# Patient Record
Sex: Female | Born: 1955 | Race: White | Hispanic: No | Marital: Married | Smoking: Never smoker
Health system: Southern US, Community
[De-identification: ages and names within clinical notes are randomized; demographics above are authoritative.]

## PROBLEM LIST (undated history)

## (undated) DIAGNOSIS — I2510 Atherosclerotic heart disease of native coronary artery without angina pectoris: Secondary | ICD-10-CM

## (undated) DIAGNOSIS — E785 Hyperlipidemia, unspecified: Secondary | ICD-10-CM

## (undated) HISTORY — PX: SKIN SURGERY: SHX2413

## (undated) HISTORY — DX: Atherosclerotic heart disease of native coronary artery without angina pectoris: I25.10

## (undated) HISTORY — DX: Hyperlipidemia, unspecified: E78.5

## (undated) HISTORY — PX: OTHER SURGICAL HISTORY: SHX169

---

## 1997-08-04 ENCOUNTER — Ambulatory Visit (HOSPITAL_COMMUNITY): Admission: RE | Admit: 1997-08-04 | Discharge: 1997-08-04 | Payer: Self-pay | Admitting: *Deleted

## 1998-08-02 ENCOUNTER — Other Ambulatory Visit: Admission: RE | Admit: 1998-08-02 | Discharge: 1998-08-02 | Payer: Self-pay | Admitting: *Deleted

## 1998-09-11 ENCOUNTER — Ambulatory Visit (HOSPITAL_COMMUNITY): Admission: RE | Admit: 1998-09-11 | Discharge: 1998-09-11 | Payer: Self-pay | Admitting: *Deleted

## 1998-09-11 ENCOUNTER — Encounter: Payer: Self-pay | Admitting: *Deleted

## 1999-08-09 ENCOUNTER — Other Ambulatory Visit: Admission: RE | Admit: 1999-08-09 | Discharge: 1999-08-09 | Payer: Self-pay | Admitting: *Deleted

## 1999-09-13 ENCOUNTER — Ambulatory Visit (HOSPITAL_COMMUNITY): Admission: RE | Admit: 1999-09-13 | Discharge: 1999-09-13 | Payer: Self-pay | Admitting: *Deleted

## 1999-09-13 ENCOUNTER — Encounter: Payer: Self-pay | Admitting: *Deleted

## 2000-08-20 ENCOUNTER — Other Ambulatory Visit: Admission: RE | Admit: 2000-08-20 | Discharge: 2000-08-20 | Payer: Self-pay | Admitting: *Deleted

## 2000-09-18 ENCOUNTER — Encounter (INDEPENDENT_AMBULATORY_CARE_PROVIDER_SITE_OTHER): Payer: Self-pay

## 2000-09-18 ENCOUNTER — Other Ambulatory Visit: Admission: RE | Admit: 2000-09-18 | Discharge: 2000-09-18 | Payer: Self-pay | Admitting: *Deleted

## 2000-09-22 ENCOUNTER — Encounter: Payer: Self-pay | Admitting: *Deleted

## 2000-09-22 ENCOUNTER — Ambulatory Visit (HOSPITAL_COMMUNITY): Admission: RE | Admit: 2000-09-22 | Discharge: 2000-09-22 | Payer: Self-pay | Admitting: *Deleted

## 2000-10-09 ENCOUNTER — Other Ambulatory Visit: Admission: RE | Admit: 2000-10-09 | Discharge: 2000-10-09 | Payer: Self-pay | Admitting: *Deleted

## 2000-10-09 ENCOUNTER — Encounter (INDEPENDENT_AMBULATORY_CARE_PROVIDER_SITE_OTHER): Payer: Self-pay | Admitting: Specialist

## 2001-08-31 ENCOUNTER — Other Ambulatory Visit: Admission: RE | Admit: 2001-08-31 | Discharge: 2001-08-31 | Payer: Self-pay | Admitting: *Deleted

## 2001-09-29 ENCOUNTER — Ambulatory Visit (HOSPITAL_COMMUNITY): Admission: RE | Admit: 2001-09-29 | Discharge: 2001-09-29 | Payer: Self-pay | Admitting: *Deleted

## 2001-09-29 ENCOUNTER — Encounter: Payer: Self-pay | Admitting: *Deleted

## 2002-09-13 ENCOUNTER — Other Ambulatory Visit: Admission: RE | Admit: 2002-09-13 | Discharge: 2002-09-13 | Payer: Self-pay | Admitting: Obstetrics and Gynecology

## 2002-10-04 ENCOUNTER — Ambulatory Visit (HOSPITAL_COMMUNITY): Admission: RE | Admit: 2002-10-04 | Discharge: 2002-10-04 | Payer: Self-pay | Admitting: Obstetrics and Gynecology

## 2002-10-04 ENCOUNTER — Encounter: Payer: Self-pay | Admitting: Obstetrics and Gynecology

## 2003-09-26 ENCOUNTER — Other Ambulatory Visit: Admission: RE | Admit: 2003-09-26 | Discharge: 2003-09-26 | Payer: Self-pay | Admitting: Obstetrics and Gynecology

## 2003-10-12 ENCOUNTER — Ambulatory Visit (HOSPITAL_COMMUNITY): Admission: RE | Admit: 2003-10-12 | Discharge: 2003-10-12 | Payer: Self-pay | Admitting: Obstetrics and Gynecology

## 2003-10-20 ENCOUNTER — Encounter: Admission: RE | Admit: 2003-10-20 | Discharge: 2003-10-20 | Payer: Self-pay | Admitting: Obstetrics and Gynecology

## 2004-11-22 ENCOUNTER — Encounter: Admission: RE | Admit: 2004-11-22 | Discharge: 2004-11-22 | Payer: Self-pay | Admitting: Obstetrics and Gynecology

## 2005-12-05 ENCOUNTER — Encounter: Admission: RE | Admit: 2005-12-05 | Discharge: 2005-12-05 | Payer: Self-pay | Admitting: Obstetrics and Gynecology

## 2006-04-07 ENCOUNTER — Emergency Department (HOSPITAL_COMMUNITY): Admission: EM | Admit: 2006-04-07 | Discharge: 2006-04-07 | Payer: Self-pay | Admitting: Emergency Medicine

## 2006-12-16 ENCOUNTER — Ambulatory Visit (HOSPITAL_COMMUNITY): Admission: RE | Admit: 2006-12-16 | Discharge: 2006-12-16 | Payer: Self-pay | Admitting: Family Medicine

## 2007-12-30 ENCOUNTER — Ambulatory Visit (HOSPITAL_COMMUNITY): Admission: RE | Admit: 2007-12-30 | Discharge: 2007-12-30 | Payer: Self-pay | Admitting: Obstetrics and Gynecology

## 2009-01-10 ENCOUNTER — Ambulatory Visit (HOSPITAL_COMMUNITY): Admission: RE | Admit: 2009-01-10 | Discharge: 2009-01-10 | Payer: Self-pay | Admitting: Obstetrics and Gynecology

## 2009-06-07 ENCOUNTER — Ambulatory Visit (HOSPITAL_BASED_OUTPATIENT_CLINIC_OR_DEPARTMENT_OTHER): Admission: RE | Admit: 2009-06-07 | Discharge: 2009-06-07 | Payer: Self-pay | Admitting: Obstetrics and Gynecology

## 2010-02-05 ENCOUNTER — Ambulatory Visit (HOSPITAL_COMMUNITY): Admission: RE | Admit: 2010-02-05 | Discharge: 2010-02-05 | Payer: Self-pay | Admitting: Family Medicine

## 2010-02-12 ENCOUNTER — Encounter: Admission: RE | Admit: 2010-02-12 | Discharge: 2010-02-12 | Payer: Self-pay | Admitting: Family Medicine

## 2010-06-03 ENCOUNTER — Encounter: Payer: Self-pay | Admitting: Obstetrics and Gynecology

## 2010-07-30 LAB — POCT HEMOGLOBIN-HEMACUE: Hemoglobin: 13.8 g/dL (ref 12.0–15.0)

## 2011-03-13 ENCOUNTER — Other Ambulatory Visit: Payer: Self-pay | Admitting: Family Medicine

## 2011-03-13 DIAGNOSIS — Z1231 Encounter for screening mammogram for malignant neoplasm of breast: Secondary | ICD-10-CM

## 2011-04-01 ENCOUNTER — Ambulatory Visit
Admission: RE | Admit: 2011-04-01 | Discharge: 2011-04-01 | Disposition: A | Discharge status: Home / Self Care | Payer: 59 | Source: Ambulatory Visit | Attending: Family Medicine | Admitting: Family Medicine

## 2011-04-01 DIAGNOSIS — Z1231 Encounter for screening mammogram for malignant neoplasm of breast: Secondary | ICD-10-CM

## 2012-03-25 ENCOUNTER — Other Ambulatory Visit: Payer: Self-pay | Admitting: Family Medicine

## 2012-03-25 DIAGNOSIS — Z1231 Encounter for screening mammogram for malignant neoplasm of breast: Secondary | ICD-10-CM

## 2012-05-12 ENCOUNTER — Ambulatory Visit
Admission: RE | Admit: 2012-05-12 | Discharge: 2012-05-12 | Disposition: A | Discharge status: Home / Self Care | Payer: 59 | Source: Ambulatory Visit | Attending: Family Medicine | Admitting: Family Medicine

## 2012-05-12 DIAGNOSIS — Z1231 Encounter for screening mammogram for malignant neoplasm of breast: Secondary | ICD-10-CM

## 2013-04-19 ENCOUNTER — Other Ambulatory Visit: Payer: Self-pay

## 2013-04-19 DIAGNOSIS — Z1231 Encounter for screening mammogram for malignant neoplasm of breast: Secondary | ICD-10-CM

## 2013-05-24 ENCOUNTER — Ambulatory Visit
Admission: RE | Admit: 2013-05-24 | Discharge: 2013-05-24 | Disposition: A | Discharge status: Home / Self Care | Payer: 59 | Source: Ambulatory Visit

## 2013-05-24 DIAGNOSIS — Z1231 Encounter for screening mammogram for malignant neoplasm of breast: Secondary | ICD-10-CM

## 2014-06-06 ENCOUNTER — Other Ambulatory Visit: Payer: Self-pay

## 2014-06-06 DIAGNOSIS — Z1231 Encounter for screening mammogram for malignant neoplasm of breast: Secondary | ICD-10-CM

## 2014-06-20 ENCOUNTER — Ambulatory Visit
Admission: RE | Admit: 2014-06-20 | Discharge: 2014-06-20 | Disposition: A | Discharge status: Home / Self Care | Payer: BLUE CROSS/BLUE SHIELD | Source: Ambulatory Visit

## 2014-06-20 DIAGNOSIS — Z1231 Encounter for screening mammogram for malignant neoplasm of breast: Secondary | ICD-10-CM

## 2015-05-30 ENCOUNTER — Other Ambulatory Visit: Payer: Self-pay

## 2015-05-30 DIAGNOSIS — Z1231 Encounter for screening mammogram for malignant neoplasm of breast: Secondary | ICD-10-CM

## 2015-06-23 ENCOUNTER — Ambulatory Visit
Admission: RE | Admit: 2015-06-23 | Discharge: 2015-06-23 | Disposition: A | Discharge status: Home / Self Care | Payer: BLUE CROSS/BLUE SHIELD | Source: Ambulatory Visit

## 2015-06-23 DIAGNOSIS — Z1231 Encounter for screening mammogram for malignant neoplasm of breast: Secondary | ICD-10-CM

## 2016-05-24 ENCOUNTER — Other Ambulatory Visit: Payer: Self-pay | Admitting: Family Medicine

## 2016-05-24 DIAGNOSIS — Z1231 Encounter for screening mammogram for malignant neoplasm of breast: Secondary | ICD-10-CM

## 2016-06-24 ENCOUNTER — Ambulatory Visit: Payer: BLUE CROSS/BLUE SHIELD

## 2016-06-25 ENCOUNTER — Ambulatory Visit
Admission: RE | Admit: 2016-06-25 | Discharge: 2016-06-25 | Disposition: A | Discharge status: Home / Self Care | Payer: BLUE CROSS/BLUE SHIELD | Source: Ambulatory Visit | Attending: Family Medicine | Admitting: Family Medicine

## 2016-06-25 DIAGNOSIS — Z1231 Encounter for screening mammogram for malignant neoplasm of breast: Secondary | ICD-10-CM

## 2017-05-26 ENCOUNTER — Other Ambulatory Visit: Payer: Self-pay | Admitting: Family Medicine

## 2017-05-26 DIAGNOSIS — Z1231 Encounter for screening mammogram for malignant neoplasm of breast: Secondary | ICD-10-CM

## 2017-07-08 ENCOUNTER — Ambulatory Visit
Admission: RE | Admit: 2017-07-08 | Discharge: 2017-07-08 | Disposition: A | Discharge status: Home / Self Care | Payer: BLUE CROSS/BLUE SHIELD | Source: Ambulatory Visit | Attending: Family Medicine | Admitting: Family Medicine

## 2017-07-08 DIAGNOSIS — Z1231 Encounter for screening mammogram for malignant neoplasm of breast: Secondary | ICD-10-CM

## 2018-06-15 ENCOUNTER — Other Ambulatory Visit: Payer: Self-pay | Admitting: Family Medicine

## 2018-06-15 DIAGNOSIS — Z1231 Encounter for screening mammogram for malignant neoplasm of breast: Secondary | ICD-10-CM

## 2018-07-29 ENCOUNTER — Ambulatory Visit: Payer: BLUE CROSS/BLUE SHIELD

## 2018-11-05 ENCOUNTER — Other Ambulatory Visit: Payer: Self-pay

## 2018-11-05 ENCOUNTER — Ambulatory Visit
Admission: RE | Admit: 2018-11-05 | Discharge: 2018-11-05 | Disposition: A | Discharge status: Home / Self Care | Payer: BC Managed Care – PPO | Source: Ambulatory Visit | Attending: Family Medicine | Admitting: Family Medicine

## 2018-11-05 DIAGNOSIS — Z1231 Encounter for screening mammogram for malignant neoplasm of breast: Secondary | ICD-10-CM

## 2019-04-20 ENCOUNTER — Other Ambulatory Visit: Payer: Self-pay

## 2019-04-20 DIAGNOSIS — Z20822 Contact with and (suspected) exposure to covid-19: Secondary | ICD-10-CM

## 2019-04-21 LAB — NOVEL CORONAVIRUS, NAA: SARS-CoV-2, NAA: NOT DETECTED

## 2019-07-27 ENCOUNTER — Other Ambulatory Visit: Payer: Self-pay | Admitting: Internal Medicine

## 2019-07-27 DIAGNOSIS — E785 Hyperlipidemia, unspecified: Secondary | ICD-10-CM

## 2019-08-12 ENCOUNTER — Other Ambulatory Visit: Payer: Self-pay | Admitting: Internal Medicine

## 2019-08-12 ENCOUNTER — Ambulatory Visit
Admission: RE | Admit: 2019-08-12 | Discharge: 2019-08-12 | Disposition: A | Discharge status: Home / Self Care | Payer: No Typology Code available for payment source | Source: Ambulatory Visit | Attending: Internal Medicine | Admitting: Internal Medicine

## 2019-08-12 DIAGNOSIS — E785 Hyperlipidemia, unspecified: Secondary | ICD-10-CM

## 2019-08-26 ENCOUNTER — Other Ambulatory Visit: Payer: Self-pay | Admitting: Internal Medicine

## 2019-08-26 DIAGNOSIS — E785 Hyperlipidemia, unspecified: Secondary | ICD-10-CM

## 2019-09-01 ENCOUNTER — Other Ambulatory Visit: Payer: BC Managed Care – PPO

## 2019-09-01 ENCOUNTER — Ambulatory Visit
Admission: RE | Admit: 2019-09-01 | Discharge: 2019-09-01 | Disposition: A | Discharge status: Home / Self Care | Payer: BC Managed Care – PPO | Source: Ambulatory Visit | Attending: Internal Medicine | Admitting: Internal Medicine

## 2019-09-01 DIAGNOSIS — E785 Hyperlipidemia, unspecified: Secondary | ICD-10-CM

## 2019-10-25 ENCOUNTER — Other Ambulatory Visit: Payer: Self-pay | Admitting: Obstetrics

## 2019-10-25 DIAGNOSIS — Z1231 Encounter for screening mammogram for malignant neoplasm of breast: Secondary | ICD-10-CM

## 2019-11-23 ENCOUNTER — Ambulatory Visit
Admission: RE | Admit: 2019-11-23 | Discharge: 2019-11-23 | Disposition: A | Discharge status: Home / Self Care | Payer: BC Managed Care – PPO | Source: Ambulatory Visit | Attending: Obstetrics | Admitting: Obstetrics

## 2019-11-23 ENCOUNTER — Other Ambulatory Visit: Payer: Self-pay

## 2019-11-23 DIAGNOSIS — Z1231 Encounter for screening mammogram for malignant neoplasm of breast: Secondary | ICD-10-CM

## 2020-04-12 NOTE — Progress Notes (Signed)
Cardiology Office Note:   Date:  04/14/2020  NAME:  Caroline Fowler    MRN: [id number] DOB:  [birth year]   PCP:  Maurice Small, MD  Cardiologist:  No primary care provider on file.   Referring MD: Melida Quitter, MD   Chief Complaint  Patient presents with  . Coronary Artery Disease    History of Present Illness:   Caroline Fowler is a 64 y.o. female with a hx of CAD/elevated CAC score who is being seen today for the evaluation of CAD at the request of Melida Quitter, MD.  She recently underwent coronary calcium scoring in April and found that she had an elevated value.  She was started on Crestor by her primary care physician.  Her most recent lipid profile shows her LDL is 55.  She was noted to have an abnormal EKG and then referred to cardiology for further evaluation.  She has a strong family history of heart disease.  Her brother died of a heart attack in his sleep.  Her sister and another brother have heart disease as well.  Blood pressure is a bit elevated today 146/92.  She reports that it is well within normal limits at other doctors visits and at home.  She is a bit nervous and anxious about today's visit.  She is an avid exerciser.  She walks 2 to 4 miles per day without any chest pain or shortness of breath.  She has a normal body weight with a BMI of 22.  Her diet is reported as very good.  She is a never smoker.  She does not drink alcohol or use drugs.  She overall is in good health and denies any symptoms of angina or chest pain.  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  This could be artifact.  She needs an echocardiogram.  She is also on aspirin.  She does take metoprolol but apparently her blood pressure has been within normal limits.  Problem List 1. CAD -CAC score 256 (94th percentile) 2. HLD -Total cholesterol 135, LDL 55, HDL 64, triglycerides 81.  Past Medical History: Past Medical History:  Diagnosis Date  . Hyperlipidemia     Past Surgical  History: Past Surgical History:  Procedure Laterality Date  . SKIN SURGERY      Current Medications: Current Meds  Medication Sig  . BABY ASPIRIN PO Take 81 mg by mouth daily.  Marland Kitchen levothyroxine (SYNTHROID) 25 MCG tablet Take 25 mcg by mouth daily before breakfast.  . metoprolol succinate (TOPROL-XL) 50 MG 24 hr tablet Take 50 mg by mouth daily. Take with or immediately following a meal.  . rosuvastatin (CRESTOR) 20 MG tablet Take 20 mg by mouth daily.  . SUMAtriptan (IMITREX) 100 MG tablet Take 100 mg by mouth as needed for migraine. May repeat in 2 hours if headache persists or recurs.     Allergies:    Penicillins and Sulfa antibiotics   Social History: Social History   Socioeconomic History  . Marital status: Married    Spouse name: Not on file  . Number of children: 2  . Years of education: Not on file  . Highest education level: Not on file  Occupational History  . Occupation: retired  Tobacco Use  . Smoking status: Never Smoker  . Smokeless tobacco: Never Used  Substance and Sexual Activity  . Alcohol use: Yes  . Drug use: Never  . Sexual activity: Not on file  Other Topics Concern  . Not  on file  Social History Narrative  . Not on file   Social Determinants of Health   Financial Resource Strain:   . Difficulty of Paying Living Expenses: Not on file  Food Insecurity:   . Worried About Programme researcher, broadcasting/film/video in the Last Year: Not on file  . Ran Out of Food in the Last Year: Not on file  Transportation Needs:   . Lack of Transportation (Medical): Not on file  . Lack of Transportation (Non-Medical): Not on file  Physical Activity:   . Days of Exercise per Week: Not on file  . Minutes of Exercise per Session: Not on file  Stress:   . Feeling of Stress : Not on file  Social Connections:   . Frequency of Communication with Friends and Family: Not on file  . Frequency of Social Gatherings with Friends and Family: Not on file  . Attends Religious Services: Not on  file  . Active Member of Clubs or Organizations: Not on file  . Attends Banker Meetings: Not on file  . Marital Status: Not on file     Family History: The patient's family history includes Heart attack in her brother and father.  ROS:   All other ROS reviewed and negative. Pertinent positives noted in the HPI.     EKGs/Labs/Other Studies Reviewed:   The following studies were personally reviewed by me today:  EKG:  EKG is ordered today.  The ekg ordered today demonstrates normal sinus rhythm, heart rate 60, anteroseptal infarct noted, and was personally reviewed by me.   CT CAC Score 09/01/2019 1. Three-vessel coronary artery calcification.  2. Total Agatston Score: 256  3. MESA age and sex matched database percentile: 85  Recent Labs: No results found for requested labs within last 8760 hours.   Recent Lipid Panel No results found for: CHOL, TRIG, HDL, CHOLHDL, VLDL, LDLCALC, LDLDIRECT  Physical Exam:   VS:  BP (!) 146/92 (BP Location: Left Arm, Patient Position: Sitting, Cuff Size: Normal)   Pulse 60   Ht 5\' 9"  (1.753 m)   Wt 150 lb (68 kg)   BMI 22.15 kg/m    Wt Readings from Last 3 Encounters:  04/14/20 150 lb (68 kg)    General: Well nourished, well developed, in no acute distress Heart: Atraumatic, normal size  Eyes: PEERLA, EOMI  Neck: Supple, no JVD Endocrine: No thryomegaly Cardiac: Normal S1, S2; RRR; no murmurs, rubs, or gallops Lungs: Clear to auscultation bilaterally, no wheezing, rhonchi or rales  Abd: Soft, nontender, no hepatomegaly  Ext: No edema, pulses 2+ Musculoskeletal: No deformities, BUE and BLE strength normal and equal Skin: Warm and dry, no rashes   Neuro: Alert and oriented to person, place, time, and situation, CNII-XII grossly intact, no focal deficits  Psych: Normal mood and affect   ASSESSMENT:   Caroline Fowler is a 64 y.o. female who presents for the following: 1. 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 Agatston coronary artery calcium score between 200 and 399   3. Nonspecific abnormal electrocardiogram (ECG) (EKG)     PLAN:   1. Coronary artery disease involving native coronary artery of native heart without angina pectoris 2. Agatston coronary artery calcium score between 200 and 399 3. Nonspecific abnormal electrocardiogram (ECG) (EKG) -Elevated coronary calcium score in the 94th percentile.  Still less than 400.  EKG with possible old anteroseptal infarct.  She needs an echo. -No symptoms  of angina.  Able to walk 2 to 4 miles per day without any limitations.  Given that her score is less than 400 I see no need for preemptive stress test.  She is doing this daily. -She should continue aspirin 81 mg a day.  She should keep an eye on her blood pressure.  She tells me it is normal at home.  Her LDL cholesterol is at goal.  55.  She is on Crestor 20 mg a day.  This is at goal for her.  I would not make any changes. -I will notify her of the results of her echo by phone.  She will see [place] yearly.  Disposition: Return in about 1 year (around 04/14/2021).  Medication Adjustments/Labs and Tests Ordered: Current medicines are reviewed at length with the patient today.  Concerns regarding medicines are outlined above.  Orders Placed This Encounter  Procedures  . EKG 12-Lead  . ECHOCARDIOGRAM COMPLETE   No orders of the defined types were placed in this encounter.   Patient Instructions  Medication Instructions:  Your physician recommends that you continue on your current medications as directed. Please refer to the Current Medication list given to you today.  *If you need a refill on your cardiac medications before your next appointment, please call your pharmacy*   Lab Work: -None If you have labs (blood work) drawn today and your tests are completely normal, you will receive your results only by: Marland Kitchen MyChart Message (if you  have MyChart) OR . A paper copy in the mail If you have any lab test that is abnormal or we need to change your treatment, we will call you to review the results.   Testing/Procedures: Your physician has requested that you have an echocardiogram. Echocardiography is a painless test that uses sound waves to create images of your heart. It provides your doctor with information about the size and shape of your heart and how well your heart's chambers and valves are working. This procedure takes approximately one hour. There are no restrictions for this procedure.     Follow-Up: At [facility], you and your health needs are our priority.  As part of our continuing mission to provide you with exceptional heart care, we have created designated Provider Care Teams.  These Care Teams include your primary Cardiologist (physician) and Advanced Practice Providers (APPs -  Physician Assistants and Nurse Practitioners) who all work together to provide you with the care you need, when you need it.  We recommend signing up for the patient portal called "MyChart".  Sign up information is provided on this After Visit Summary.  MyChart is used to connect with patients for Virtual Visits (Telemedicine).  Patients are able to view lab/test results, encounter notes, upcoming appointments, etc.  Non-urgent messages can be sent to your provider as well.   To learn more about what you can do with MyChart, go to [email or web address].    Your next appointment:   1 year(s)  The format for your next appointment:   In Person  Provider:   Lennie Odor, MD   Other Instructions Your physician wants you to follow-up in: 1 year with Dr. Flora Lipps.  You will receive a reminder letter in the mail two months in advance. If you don't receive a letter, please call our office to schedule the follow-up appointment.        Signed, Lenna Gilford. Flora Lipps, MD [facility]  [address] [place], [place]  [id number] [phone]  04/14/2020 9:25 AM

## 2020-04-14 ENCOUNTER — Encounter: Payer: Self-pay | Admitting: Cardiovascular Disease

## 2020-04-14 ENCOUNTER — Ambulatory Visit (INDEPENDENT_AMBULATORY_CARE_PROVIDER_SITE_OTHER): Payer: BC Managed Care – PPO | Admitting: Cardiovascular Disease

## 2020-04-14 ENCOUNTER — Other Ambulatory Visit: Payer: Self-pay

## 2020-04-14 VITALS — BP 146/92 | HR 60 | Ht 69.0 in | Wt 150.0 lb

## 2020-04-14 DIAGNOSIS — R931 Abnormal findings on diagnostic imaging of heart and coronary circulation: Secondary | ICD-10-CM | POA: Diagnosis not present

## 2020-04-14 DIAGNOSIS — I2510 Atherosclerotic heart disease of native coronary artery without angina pectoris: Secondary | ICD-10-CM

## 2020-04-14 DIAGNOSIS — R9431 Abnormal electrocardiogram [ECG] [EKG]: Secondary | ICD-10-CM | POA: Diagnosis not present

## 2020-04-14 NOTE — Patient Instructions (Signed)
Medication Instructions:  Your physician recommends that you continue on your current medications as directed. Please refer to the Current Medication list given to you today.  *If you need a refill on your cardiac medications before your next appointment, please call your pharmacy*   Lab Work: -None If you have labs (blood work) drawn today and your tests are completely normal, you will receive your results only by:  MyChart Message (if you have MyChart) OR  A paper copy in the mail If you have any lab test that is abnormal or we need to change your treatment, we will call you to review the results.   Testing/Procedures: Your physician has requested that you have an echocardiogram. Echocardiography is a painless test that uses sound waves to create images of your heart. It provides your doctor with information about the size and shape of your heart and how well your hearts chambers and valves are working. This procedure takes approximately one hour. There are no restrictions for this procedure.     Follow-Up: At [facility], you and your health needs are our priority.  As part of our continuing mission to provide you with exceptional heart care, we have created designated Provider Care Teams.  These Care Teams include your primary Cardiologist (physician) and Advanced Practice Providers (APPs -  Physician Assistants and Nurse Practitioners) who all work together to provide you with the care you need, when you need it.  We recommend signing up for the patient portal called "MyChart".  Sign up information is provided on this After Visit Summary.  MyChart is used to connect with patients for Virtual Visits (Telemedicine).  Patients are able to view lab/test results, encounter notes, upcoming appointments, etc.  Non-urgent messages can be sent to your provider as well.   To learn more about what you can do with MyChart, go to [email or web address].    Your next appointment:   1  year(s)  The format for your next appointment:   In Person  Provider:   Lennie Odor, MD   Other Instructions Your physician wants you to follow-up in: 1 year with Dr. Flora Lipps.  You will receive a reminder letter in the mail two months in advance. If you don't receive a letter, please call our office to schedule the follow-up appointment.

## 2020-05-08 ENCOUNTER — Ambulatory Visit (HOSPITAL_COMMUNITY): Payer: BC Managed Care – PPO | Attending: Cardiology

## 2020-05-08 ENCOUNTER — Other Ambulatory Visit: Payer: Self-pay

## 2020-05-08 DIAGNOSIS — I2510 Atherosclerotic heart disease of native coronary artery without angina pectoris: Secondary | ICD-10-CM | POA: Insufficient documentation

## 2020-05-08 DIAGNOSIS — R931 Abnormal findings on diagnostic imaging of heart and coronary circulation: Secondary | ICD-10-CM | POA: Insufficient documentation

## 2020-05-08 DIAGNOSIS — R9431 Abnormal electrocardiogram [ECG] [EKG]: Secondary | ICD-10-CM | POA: Insufficient documentation

## 2020-05-08 LAB — ECHOCARDIOGRAM COMPLETE
Area-P 1/2: 1.73 cm2
S' Lateral: 2.57 cm

## 2020-10-03 ENCOUNTER — Other Ambulatory Visit: Payer: Self-pay | Admitting: Internal Medicine

## 2020-10-03 DIAGNOSIS — M8580 Other specified disorders of bone density and structure, unspecified site: Secondary | ICD-10-CM

## 2020-10-10 ENCOUNTER — Other Ambulatory Visit: Payer: Self-pay | Admitting: Obstetrics

## 2020-10-10 DIAGNOSIS — Z1231 Encounter for screening mammogram for malignant neoplasm of breast: Secondary | ICD-10-CM

## 2020-10-12 ENCOUNTER — Other Ambulatory Visit: Payer: Self-pay

## 2020-10-12 ENCOUNTER — Ambulatory Visit
Admission: RE | Admit: 2020-10-12 | Discharge: 2020-10-12 | Disposition: A | Discharge status: Home / Self Care | Payer: BC Managed Care – PPO | Source: Ambulatory Visit | Attending: Internal Medicine | Admitting: Internal Medicine

## 2020-10-12 DIAGNOSIS — M8580 Other specified disorders of bone density and structure, unspecified site: Secondary | ICD-10-CM

## 2020-12-06 ENCOUNTER — Ambulatory Visit
Admission: RE | Admit: 2020-12-06 | Discharge: 2020-12-06 | Disposition: A | Discharge status: Home / Self Care | Payer: BC Managed Care – PPO | Source: Ambulatory Visit | Attending: Obstetrics | Admitting: Obstetrics

## 2020-12-06 ENCOUNTER — Ambulatory Visit: Payer: BC Managed Care – PPO

## 2020-12-06 ENCOUNTER — Other Ambulatory Visit: Payer: Self-pay

## 2020-12-06 DIAGNOSIS — Z1231 Encounter for screening mammogram for malignant neoplasm of breast: Secondary | ICD-10-CM

## 2021-06-19 NOTE — Progress Notes (Signed)
Cardiology Office Note:   Date:  06/20/2021  NAME:  Chaylee Ehrsam    MRN: [id number] DOB:  [birth year]   PCP:  Melida Quitter, MD  Cardiologist:  None  Electrophysiologist:  None   Referring MD: Melida Quitter, MD   Chief Complaint  Patient presents with   Follow-up        History of Present Illness:   Zoie Sarin is a 66 y.o. female with a hx of elevated coronary calcium score, HLD who presents for follow-up.  She reports she is doing well.  Denies any chest pain or trouble breathing.  She has been on aspirin as well as Crestor.  Most recent LDL cholesterol 72.  This is likely acceptable.  She reports she is walking several miles per day without limitations.  She does have some heaviness in her legs on hot days.  She has exquisite pulses on exam.  I do not believe this is PAD.  She can actually walk in cooler weather without limitations.  We did discuss that this is not PAD.  All of her lab work is stable.  She denies any significant symptoms at office.  She is doing well.  Problem List 1. CAD -CAC score 256 (94th percentile) 2. HLD -Total cholesterol 150, HDL 60, LDL 72, triglycerides 90  Past Medical History: Past Medical History:  Diagnosis Date   Coronary artery disease    Hyperlipidemia     Past Surgical History: Past Surgical History:  Procedure Laterality Date   cataract surgery Bilateral    SKIN SURGERY      Current Medications: Current Meds  Medication Sig   BABY ASPIRIN PO Take 81 mg by mouth daily.   Fremanezumab-vfrm (AJOVY) 225 MG/1.5ML SOAJ Ajovy 225 mg/1.5 mL subcutaneous auto-injector  Inject by subcutaneous route for 90 days.   levothyroxine (SYNTHROID) 25 MCG tablet Take 25 mcg by mouth daily before breakfast.   metoprolol succinate (TOPROL-XL) 25 MG 24 hr tablet metoprolol succinate ER 25 mg tablet,extended release 24 hr  TAKE 2 TABLETS BY MOUTH EVERY DAY   rosuvastatin (CRESTOR) 20 MG tablet Take 20 mg by mouth daily.   sertraline  (ZOLOFT) 100 MG tablet Take 100 mg by mouth daily.   SUMAtriptan (IMITREX) 100 MG tablet Take 100 mg by mouth as needed for migraine. May repeat in 2 hours if headache persists or recurs.     Allergies:    Penicillins and Sulfa antibiotics   Social History: Social History   Socioeconomic History   Marital status: Married    Spouse name: Not on file   Number of children: 2   Years of education: Not on file   Highest education level: Not on file  Occupational History   Occupation: retired  Tobacco Use   Smoking status: Never   Smokeless tobacco: Never  Substance and Sexual Activity   Alcohol use: Yes   Drug use: Never   Sexual activity: Not on file  Other Topics Concern   Not on file  Social History Narrative   Not on file   Social Determinants of Health   Financial Resource Strain: Not on file  Food Insecurity: Not on file  Transportation Needs: Not on file  Physical Activity: Not on file  Stress: Not on file  Social Connections: Not on file     Family History: The patient's family history includes Heart attack in her brother and father.  ROS:   All other ROS reviewed and negative. Pertinent positives noted  in the HPI.     EKGs/Labs/Other Studies Reviewed:   The following studies were personally reviewed by me today:  TTE 05/08/2020  1. Left ventricular ejection fraction, by estimation, is 60 to 65%. The  left ventricle has normal function. The left ventricle has no regional  wall motion abnormalities. Left ventricular diastolic parameters were  normal.   2. Right ventricular systolic function is normal. The right ventricular  size is normal. There is normal pulmonary artery systolic pressure. The  estimated right ventricular systolic pressure is 29.9 mmHg.   3. The mitral valve is normal in structure. Trivial mitral valve  regurgitation.   4. The aortic valve is tricuspid. Aortic valve regurgitation is not  visualized. No aortic stenosis is present.   5.  The inferior vena cava is dilated in size with >50% respiratory  variability, suggesting right atrial pressure of 8 mmHg.   CT CAC Score 09/01/2019 1. Three-vessel coronary artery calcification.   2. Total Agatston Score: 256   3. MESA age and sex matched database percentile: 58  Recent Labs: No results found for requested labs within last 8760 hours.   Recent Lipid Panel No results found for: CHOL, TRIG, HDL, CHOLHDL, VLDL, LDLCALC, LDLDIRECT  Physical Exam:   VS:  BP 124/82    Pulse 66    Ht 5\' 9"  (1.753 m)    Wt 148 lb 12.8 oz (67.5 kg)    SpO2 99%    BMI 21.97 kg/m    Wt Readings from Last 3 Encounters:  06/20/21 148 lb 12.8 oz (67.5 kg)  04/14/20 150 lb (68 kg)    General: Well nourished, well developed, in no acute distress Head: Atraumatic, normal size  Eyes: PEERLA, EOMI  Neck: Supple, no JVD Endocrine: No thryomegaly Cardiac: Normal S1, S2; RRR; no murmurs, rubs, or gallops Lungs: Clear to auscultation bilaterally, no wheezing, rhonchi or rales  Abd: Soft, nontender, no hepatomegaly  Ext: No edema, pulses 2+ Musculoskeletal: No deformities, BUE and BLE strength normal and equal Skin: Warm and dry, no rashes   Neuro: Alert and oriented to person, place, time, and situation, CNII-XII grossly intact, no focal deficits  Psych: Normal mood and affect   ASSESSMENT:   Adiah Dragotta is a 66 y.o. female who presents for the following: 1. Agatston coronary artery calcium score between 200 and 399   2. Coronary artery disease involving native coronary artery of native heart without angina pectoris   3. Mixed hyperlipidemia     PLAN:   1. Agatston coronary artery calcium score between 200 and 399 2. Coronary artery disease involving native coronary artery of native heart without angina pectoris 3. Mixed hyperlipidemia -Coronary calcium score 256 which is 94th percentile.  Echo showed normal LV function.  No symptoms concerning for angina.  Her most recent LDL  cholesterol 72.  I believe this is acceptable given her score and age.  We will continue Crestor 20 mg daily.  Aspirin 81 is okay.  No bleeding issues.  She does describe some heaviness in her legs but has exquisite pulses on exam.  I do not believe this is PAD.  I believe this is likely heat intolerance.  She will keep a close diary.  I recommended regular exercise as well as adequate hydration.  Disposition: Return in about 1 year (around 06/20/2022).  Medication Adjustments/Labs and Tests Ordered: Current medicines are reviewed at length with the patient today.  Concerns regarding medicines are outlined above.  No orders of the  defined types were placed in this encounter.  No orders of the defined types were placed in this encounter.   Patient Instructions  Medication Instructions:  The current medical regimen is effective;  continue present plan and medications.  *If you need a refill on your cardiac medications before your next appointment, please call your pharmacy*   Follow-Up: At [facility], you and your health needs are our priority.  As part of our continuing mission to provide you with exceptional heart care, we have created designated Provider Care Teams.  These Care Teams include your primary Cardiologist (physician) and Advanced Practice Providers (APPs -  Physician Assistants and Nurse Practitioners) who all work together to provide you with the care you need, when you need it.  We recommend signing up for the patient portal called "MyChart".  Sign up information is provided on this After Visit Summary.  MyChart is used to connect with patients for Virtual Visits (Telemedicine).  Patients are able to view lab/test results, encounter notes, upcoming appointments, etc.  Non-urgent messages can be sent to your provider as well.   To learn more about what you can do with MyChart, go to [email or web address].    Your next appointment:   12 month(s)  The format for your next  appointment:   In Person  Provider:   Lennie Odor, MD or Marjie Skiff, PA-C, or Azalee Course, PA-C     Time Spent with Patient: I have spent a total of 25 minutes with patient reviewing hospital notes, telemetry, EKGs, labs and examining the patient as well as establishing an assessment and plan that was discussed with the patient.  > 50% of time was spent in direct patient care.  Signed, Lenna Gilford. Flora Lipps, MD, [facility]   [facility]  [address], [address] [place], [place] [address] [phone]  06/20/2021 9:55 AM

## 2021-06-20 ENCOUNTER — Ambulatory Visit (INDEPENDENT_AMBULATORY_CARE_PROVIDER_SITE_OTHER): Payer: BC Managed Care – PPO | Admitting: Cardiovascular Disease

## 2021-06-20 ENCOUNTER — Other Ambulatory Visit: Payer: Self-pay

## 2021-06-20 ENCOUNTER — Encounter: Payer: Self-pay | Admitting: Cardiovascular Disease

## 2021-06-20 VITALS — BP 124/82 | HR 66 | Ht 69.0 in | Wt 148.8 lb

## 2021-06-20 DIAGNOSIS — I2510 Atherosclerotic heart disease of native coronary artery without angina pectoris: Secondary | ICD-10-CM | POA: Diagnosis not present

## 2021-06-20 DIAGNOSIS — E782 Mixed hyperlipidemia: Secondary | ICD-10-CM

## 2021-06-20 DIAGNOSIS — R931 Abnormal findings on diagnostic imaging of heart and coronary circulation: Secondary | ICD-10-CM

## 2021-06-20 NOTE — Patient Instructions (Signed)
Medication Instructions:  The current medical regimen is effective;  continue present plan and medications.  *If you need a refill on your cardiac medications before your next appointment, please call your pharmacy*   Follow-Up: At [facility], you and your health needs are our priority.  As part of our continuing mission to provide you with exceptional heart care, we have created designated Provider Care Teams.  These Care Teams include your primary Cardiologist (physician) and Advanced Practice Providers (APPs -  Physician Assistants and Nurse Practitioners) who all work together to provide you with the care you need, when you need it.  We recommend signing up for the patient portal called "MyChart".  Sign up information is provided on this After Visit Summary.  MyChart is used to connect with patients for Virtual Visits (Telemedicine).  Patients are able to view lab/test results, encounter notes, upcoming appointments, etc.  Non-urgent messages can be sent to your provider as well.   To learn more about what you can do with MyChart, go to [email or web address].    Your next appointment:   12 month(s)  The format for your next appointment:   In Person  Provider:   Mona O'Neal, MD or Callie Goodrich, PA-C, or Hao Meng, PA-C {         

## 2021-11-07 ENCOUNTER — Other Ambulatory Visit: Payer: Self-pay | Admitting: Obstetrics

## 2021-11-07 DIAGNOSIS — Z1231 Encounter for screening mammogram for malignant neoplasm of breast: Secondary | ICD-10-CM

## 2021-12-18 ENCOUNTER — Ambulatory Visit: Payer: BC Managed Care – PPO

## 2021-12-27 ENCOUNTER — Ambulatory Visit
Admission: RE | Admit: 2021-12-27 | Discharge: 2021-12-27 | Disposition: A | Discharge status: Home / Self Care | Payer: BC Managed Care – PPO | Source: Ambulatory Visit | Attending: Obstetrics | Admitting: Obstetrics

## 2021-12-27 DIAGNOSIS — Z1231 Encounter for screening mammogram for malignant neoplasm of breast: Secondary | ICD-10-CM

## 2022-01-24 ENCOUNTER — Other Ambulatory Visit: Payer: Self-pay | Admitting: Obstetrics

## 2022-01-24 DIAGNOSIS — Z13820 Encounter for screening for osteoporosis: Secondary | ICD-10-CM

## 2022-07-08 NOTE — Progress Notes (Unsigned)
Cardiology Office Note:   Date:  07/10/2022  NAME:  Caroline Fowler    MRN: [id number] DOB:  04/13/1956   PCP:  Michael Boston, MD  Cardiologist:  None  Electrophysiologist:  None   Referring MD: Michael Boston, MD   Chief Complaint  Patient presents with   Follow-up   History of Present Illness:   Caroline Fowler is a 67 y.o. female with a hx of coronary calcium who presents for follow-up.  She reports for the past 3 to 4 months she has noticed a fullness in her chest.  She reports that occurs with walking.  She is walking several miles per day.  It can occur 2 to 3 miles into her walk.  She reports it does stop her.  Symptoms do improve with cessation of activity.  She is still walking 5 days/week.  She is walking several miles at a time.  She reports no pain or pressure but just notices fullness.  Her EKG shows sinus rhythm with no acute ischemic changes.  She has never had a heart attack or stroke.  Most recent LDL cholesterol is 72.  She does have an elevated calcium score.  We did discuss coronary CTA for definitive evaluation.  I do not believe she has coronary obstruction but we would make sure.  She is on metoprolol for migraines.  Pulse is regular.  CV exam normal.  No significant shortness of breath.   Problem List 1. CAD -CAC score 256 (94th percentile) 2. HLD -Total cholesterol 150, HDL 60, LDL 72, triglycerides 90 3. Migraine  -on metoprolol   Past Medical History: Past Medical History:  Diagnosis Date   Coronary artery disease    Hyperlipidemia     Past Surgical History: Past Surgical History:  Procedure Laterality Date   cataract surgery Bilateral    SKIN SURGERY      Current Medications: Current Meds  Medication Sig   BABY ASPIRIN PO Take 81 mg by mouth daily.   Fremanezumab-vfrm (AJOVY) 225 MG/1.5ML SOAJ Ajovy 225 mg/1.5 mL subcutaneous auto-injector  Inject by subcutaneous route for 90 days.   levothyroxine (SYNTHROID) 25 MCG tablet Take  25 mcg by mouth daily before breakfast.   metoprolol succinate (TOPROL-XL) 25 MG 24 hr tablet metoprolol succinate ER 25 mg tablet,extended release 24 hr  TAKE 2 TABLETS BY MOUTH EVERY DAY   rosuvastatin (CRESTOR) 20 MG tablet Take 20 mg by mouth daily.   sertraline (ZOLOFT) 100 MG tablet Take 100 mg by mouth daily.   SUMAtriptan (IMITREX) 100 MG tablet Take 100 mg by mouth as needed for migraine. May repeat in 2 hours if headache persists or recurs.     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  ROS:   All other ROS reviewed and negative. Pertinent positives noted in the HPI.     EKGs/Labs/Other Studies Reviewed:   The following studies were personally reviewed by me today:  EKG:  EKG is ordered today.  The ekg ordered today demonstrates normal sinus rhythm heart rate 67, single PAC (fusion complex), and was personally reviewed by me.   TTE 05/08/2020  1. Left ventricular ejection fraction, by estimation, is 60 to 65%. The  left ventricle has normal function. The left ventricle has no regional  wall motion  abnormalities. Left ventricular diastolic parameters were  normal.   2. Right ventricular systolic function is normal. The right ventricular  size is normal. There is normal pulmonary artery systolic pressure. The  estimated right ventricular systolic pressure is [id number] mmHg.   3. The mitral valve is normal in structure. Trivial mitral valve  regurgitation.   4. The aortic valve is tricuspid. Aortic valve regurgitation is not  visualized. No aortic stenosis is present.   5. The inferior vena cava is dilated in size with >50% respiratory  variability, suggesting right atrial pressure of 8 mmHg.   Recent Labs: No results found for requested labs within last 365 days.   Recent Lipid Panel No results found for: "CHOL", "TRIG", "HDL", "CHOLHDL", "VLDL", "LDLCALC", "LDLDIRECT"  Physical Exam:   VS:  BP (!) 141/80   Pulse 75   Ht '5\' 9"'$  (1.753 m)   Wt 152 lb 6.4 oz (69.1 kg)   SpO2 96%   BMI 22.51 kg/m    Wt Readings from Last 3 Encounters:  07/10/22 152 lb 6.4 oz (69.1 kg)  06/20/21 148 lb 12.8 oz (67.5 kg)  04/14/20 150 lb (68 kg)    General: Well nourished, well developed, in no acute distress Head: Atraumatic, normal size  Eyes: PEERLA, EOMI  Neck: Supple, no JVD Endocrine: No thryomegaly Cardiac: Normal S1, S2; RRR; no murmurs, rubs, or gallops Lungs: Clear to auscultation bilaterally, no wheezing, rhonchi or rales  Abd: Soft, nontender, no hepatomegaly  Ext: No edema, pulses 2+ Musculoskeletal: No deformities, BUE and BLE strength normal and equal Skin: Warm and dry, no rashes   Neuro: Alert and oriented to person, place, time, and situation, CNII-XII grossly intact, no focal deficits  Psych: Normal mood and affect   ASSESSMENT:   Caroline Fowler is a 67 y.o. female who presents for the following: 1. Precordial pain   2. Agatston coronary artery calcium score between 200 and 399   3. Mixed hyperlipidemia     PLAN:   1. Precordial pain 2. Agatston coronary  artery calcium score between 200 and 399 3. Mixed hyperlipidemia -Elevated coronary calcium score.  Now with fullness in her chest with activity.  EKG unchanged and unremarkable.  Symptoms do occur after several miles into a wall.  Symptoms could represent warm up angina.  Given her high calcium score I would recommend coronary CTA.  Will obtain a BMP today.  She will take 2 of her metoprolol succinate tablets the day of her scan.  She will see [place] back yearly.  She will continue aspirin.  She will continue metoprolol succinate.  She is on Crestor 20 mg daily.  LDL 72 which is  close to goal.  Goal for her is less than 70.  Further titration pending coronary CTA.  Disposition: Return in about 1 year (around 07/11/2023).  Medication Adjustments/Labs and Tests Ordered: Current medicines are reviewed at length with the patient today.  Concerns regarding medicines are outlined above.  Orders Placed This Encounter  Procedures   CT CORONARY MORPH W/CTA COR W/SCORE W/CA W/CM &/OR WO/CM   Basic metabolic panel   EKG [id number]   No orders of the defined types were placed in this encounter.   Patient Instructions  Medication Instructions:  Take 2 tablets of your Metoprolol the day of your CT scan.   *If you need a refill on your cardiac medications before your next appointment, please call your pharmacy*   Lab Work: BMET today   If you have labs (blood work) drawn today and your tests are completely normal, you will receive your results only by: [place] (if you have MyChart) OR A paper copy in the mail If you have any lab test that is abnormal or we need to change your treatment, we will call you to review the results.   Testing/Procedures: Coronary CTA- they will call you to schedule this.    Follow-Up: At [facility], you and your health needs are our priority.  As part of our continuing mission to provide you with exceptional heart care, we have created designated Provider  Care Teams.  These Care Teams include your primary Cardiologist (physician) and Advanced Practice Providers (APPs -  Physician Assistants and Nurse Practitioners) who all work together to provide you with the care you need, when you need it.  We recommend signing up for the patient portal called "MyChart".  Sign up information is provided on this After Visit Summary.  MyChart is used to connect with patients for Virtual Visits (Telemedicine).  Patients are able to view lab/test results, encounter notes, upcoming appointments, etc.  Non-urgent messages can be sent to your provider as well.   To learn more about what you can do with MyChart, go to [email or web address].    Your next appointment:   12 month(s)  Provider:   Sande Rives, PA-C, Almyra Deforest, PA-C, or Diona Browner, NP    Other Instructions   Your cardiac CT will be scheduled at one of the below locations:   [facility] [address] [place], [place] [address] [phone]  If scheduled at [facility], please arrive at the [facility] and Children's Entrance (Entrance C2) of [facility] 30 minutes prior to test start time. You can use the FREE valet parking offered at entrance C (encouraged to control the heart rate for the test)  Proceed to the [facility] Radiology Department (first floor) to check-in and test prep.  All radiology patients and guests should use entrance C2 at [facility], accessed from [facility], even though the hospital's physical address listed is [address].     Please follow these instructions carefully (unless otherwise directed):   On the Night Before the Test: Be sure to Drink plenty of water. Do not consume any caffeinated/decaffeinated beverages or chocolate 12 hours prior to your test. Do not take any antihistamines 12 hours prior to your test.  On the Day of the Test: Drink plenty of water until 1 hour prior to the test. Do not  eat any food 1 hour prior to test. You may take your regular medications prior to the test.  Take 2  tablets of Metoprolol the day of your test.  If you take Furosemide/Hydrochlorothiazide/Spironolactone, please HOLD on the morning of the test. FEMALES- please wear underwire-free bra if available, avoid dresses & tight clothing  After the Test: Drink plenty of water. After receiving IV contrast, you may experience a mild flushed feeling. This is normal. On occasion, you may experience a mild rash up to 24 hours after the test. This is not dangerous. If this occurs, you can take Benadryl 25 mg and increase your fluid intake. If you experience trouble breathing, this can be serious. If it is severe call 911 IMMEDIATELY. If it is mild, please call our office. If you take any of these medications: Glipizide/Metformin, Avandament, Glucavance, please do not take 48 hours after completing test unless otherwise instructed.  We will call to schedule your test 2-4 weeks out understanding that some insurance companies will need an authorization prior to the service being performed.   For non-scheduling related questions, please contact the cardiac imaging nurse navigator should you have any questions/concerns: Marchia Bond, Cardiac Imaging Nurse Navigator Gordy Clement, Cardiac Imaging Nurse Navigator  Heart and Vascular Services Direct Office Dial: [phone]   For scheduling needs, including cancellations and rescheduling, please call [place], [phone].     Time Spent with Patient: I have spent a total of 35 minutes with patient reviewing hospital notes, telemetry, EKGs, labs and examining the patient as well as establishing an assessment and plan that was discussed with the patient.  > 50% of time was spent in direct patient care.  Signed, Addison Naegeli. Audie Box, MD, [place]  [address] [place], [place] [address] [phone]  07/10/2022  11:00 AM

## 2022-07-10 ENCOUNTER — Ambulatory Visit: Payer: Medicare Other | Attending: Cardiovascular Disease | Admitting: Cardiovascular Disease

## 2022-07-10 ENCOUNTER — Encounter: Payer: Self-pay | Admitting: Cardiovascular Disease

## 2022-07-10 VITALS — BP 141/80 | HR 75 | Ht 69.0 in | Wt 152.4 lb

## 2022-07-10 DIAGNOSIS — R072 Precordial pain: Secondary | ICD-10-CM | POA: Insufficient documentation

## 2022-07-10 DIAGNOSIS — E782 Mixed hyperlipidemia: Secondary | ICD-10-CM | POA: Insufficient documentation

## 2022-07-10 DIAGNOSIS — R931 Abnormal findings on diagnostic imaging of heart and coronary circulation: Secondary | ICD-10-CM | POA: Diagnosis present

## 2022-07-10 NOTE — Patient Instructions (Addendum)
Medication Instructions:  Take 2 tablets of your Metoprolol the day of your CT scan.   *If you need a refill on your cardiac medications before your next appointment, please call your pharmacy*   Lab Work: BMET today   If you have labs (blood work) drawn today and your tests are completely normal, you will receive your results only by: [place] (if you have MyChart) OR A paper copy in the mail If you have any lab test that is abnormal or we need to change your treatment, we will call you to review the results.   Testing/Procedures: Coronary CTA- they will call you to schedule this.    Follow-Up: At [facility], you and your health needs are our priority.  As part of our continuing mission to provide you with exceptional heart care, we have created designated Provider Care Teams.  These Care Teams include your primary Cardiologist (physician) and Advanced Practice Providers (APPs -  Physician Assistants and Nurse Practitioners) who all work together to provide you with the care you need, when you need it.  We recommend signing up for the patient portal called "MyChart".  Sign up information is provided on this After Visit Summary.  MyChart is used to connect with patients for Virtual Visits (Telemedicine).  Patients are able to view lab/test results, encounter notes, upcoming appointments, etc.  Non-urgent messages can be sent to your provider as well.   To learn more about what you can do with MyChart, go to [email or web address].    Your next appointment:   12 month(s)  Provider:   Sande Rives, PA-C, Almyra Deforest, PA-C, or Diona Browner, NP    Other Instructions   Your cardiac CT will be scheduled at one of the below locations:   [facility] [address] [place], [place] [address] [phone]  If scheduled at [facility], please arrive at the [facility] and Children's Entrance (Entrance C2) of [facility] 30 minutes prior to  test start time. You can use the FREE valet parking offered at entrance C (encouraged to control the heart rate for the test)  Proceed to the [facility] Radiology Department (first floor) to check-in and test prep.  All radiology patients and guests should use entrance C2 at [facility], accessed from [facility], even though the hospital's physical address listed is [address]     Please follow these instructions carefully (unless otherwise directed):   On the Night Before the Test: Be sure to Drink plenty of water. Do not consume any caffeinated/decaffeinated beverages or chocolate 12 hours prior to your test. Do not take any antihistamines 12 hours prior to your test.  On the Day of the Test: Drink plenty of water until 1 hour prior to the test. Do not eat any food 1 hour prior to test. You may take your regular medications prior to the test.  Take 2 tablets of Metoprolol the day of your test.  If you take Furosemide/Hydrochlorothiazide/Spironolactone, please HOLD on the morning of the test. FEMALES- please wear underwire-free bra if available, avoid dresses & tight clothing  After the Test: Drink plenty of water. After receiving IV contrast, you may experience a mild flushed feeling. This is normal. On occasion, you may experience a mild rash up to 24 hours after the test. This is not dangerous. If this occurs, you can take Benadryl 25 mg and increase your fluid intake. If you experience trouble breathing, this can be  serious. If it is severe call 911 IMMEDIATELY. If it is mild, please call our office. If you take any of these medications: Glipizide/Metformin, Avandament, Glucavance, please do not take 48 hours after completing test unless otherwise instructed.  We will call to schedule your test 2-4 weeks out understanding that some insurance companies will need an authorization prior to the service being performed.   For non-scheduling related  questions, please contact the cardiac imaging nurse navigator should you have any questions/concerns: Marchia Bond, Cardiac Imaging Nurse Navigator Gordy Clement, Cardiac Imaging Nurse Navigator [place] [facility] Direct Office Dial: [phone]   For scheduling needs, including cancellations and rescheduling, please call [place], [phone].

## 2022-07-11 LAB — BASIC METABOLIC PANEL
BUN/Creatinine Ratio: 15 (ref 12–28)
BUN: 13 mg/dL (ref 8–27)
CO2: 25 mmol/L (ref 20–29)
Calcium: 9.6 mg/dL (ref 8.7–10.3)
Chloride: 103 mmol/L (ref 96–106)
Creatinine, Ser: 0.88 mg/dL (ref 0.57–1.00)
Glucose: 78 mg/dL (ref 70–99)
Potassium: 5.5 mmol/L — ABNORMAL HIGH (ref 3.5–5.2)
Sodium: 142 mmol/L (ref 134–144)
eGFR: 73 mL/min/{1.73_m2} (ref >59)

## 2022-07-19 ENCOUNTER — Telehealth (HOSPITAL_COMMUNITY): Payer: Self-pay | Admitting: *Deleted

## 2022-07-19 NOTE — Telephone Encounter (Signed)
Reaching out to patient to offer assistance regarding upcoming cardiac imaging study; pt verbalizes understanding of appt date/time, parking situation and where to check in, pre-test NPO status and medications ordered, and verified current allergies; name and call back number provided for further questions should they arise  Gordy Clement RN Navigator Cardiac Imaging [facility] [phone] office [phone] cell  Patient to take '50mg'$  of her metoprolol succinate two hours prior to her cardiac CT scan. She is aware to arrive at 9am.

## 2022-07-23 ENCOUNTER — Ambulatory Visit (HOSPITAL_COMMUNITY)
Admission: RE | Admit: 2022-07-23 | Discharge: 2022-07-23 | Disposition: A | Discharge status: Home / Self Care | Payer: Medicare Other | Source: Ambulatory Visit | Attending: Cardiovascular Disease | Admitting: Cardiovascular Disease

## 2022-07-23 DIAGNOSIS — R072 Precordial pain: Secondary | ICD-10-CM | POA: Insufficient documentation

## 2022-07-23 DIAGNOSIS — I700 Atherosclerosis of aorta: Secondary | ICD-10-CM

## 2022-07-23 MED ORDER — NITROGLYCERIN 0.4 MG SL SUBL
0.8000 mg | SUBLINGUAL_TABLET | Freq: Once | SUBLINGUAL | Status: AC
  Administered 2022-07-23: 0.8 mg via SUBLINGUAL

## 2022-07-23 MED ORDER — NITROGLYCERIN 0.4 MG SL SUBL
SUBLINGUAL_TABLET | SUBLINGUAL | Status: AC
  Filled 2022-07-23: qty 2

## 2022-07-23 MED ORDER — IOHEXOL 350 MG/ML SOLN
100.0000 mL | Freq: Once | INTRAVENOUS | Status: AC | PRN
  Administered 2022-07-23: 100 mL via INTRAVENOUS

## 2022-09-23 ENCOUNTER — Encounter: Payer: Self-pay | Admitting: *Deleted

## 2022-09-23 DIAGNOSIS — Z006 Encounter for examination for normal comparison and control in clinical research program: Secondary | ICD-10-CM

## 2022-09-23 NOTE — Research (Signed)
Message left for Ms Paine about V1p research. Encouraged her to call with any questions.

## 2022-11-18 ENCOUNTER — Other Ambulatory Visit: Payer: Self-pay | Admitting: Internal Medicine

## 2022-11-18 DIAGNOSIS — Z1231 Encounter for screening mammogram for malignant neoplasm of breast: Secondary | ICD-10-CM

## 2022-12-31 ENCOUNTER — Ambulatory Visit
Admission: RE | Admit: 2022-12-31 | Discharge: 2022-12-31 | Disposition: A | Discharge status: Home / Self Care | Payer: Medicare Other | Source: Ambulatory Visit | Attending: Internal Medicine | Admitting: Internal Medicine

## 2022-12-31 DIAGNOSIS — Z1231 Encounter for screening mammogram for malignant neoplasm of breast: Secondary | ICD-10-CM

## 2023-01-16 IMAGING — MG MM DIGITAL SCREENING BILAT W/ TOMO AND CAD
8 series · 9 of 24 positions shown · non-contrast
Comparison: Previous exam(s).

CLINICAL DATA: Screening.

EXAM:
DIGITAL SCREENING BILATERAL MAMMOGRAM WITH TOMOSYNTHESIS AND CAD
TECHNIQUE: Bilateral screening digital craniocaudal and mediolateral oblique
mammograms were obtained. Bilateral screening digital breast
tomosynthesis was performed. The images were evaluated with
computer-aided detection.

[L CC synth-2D]
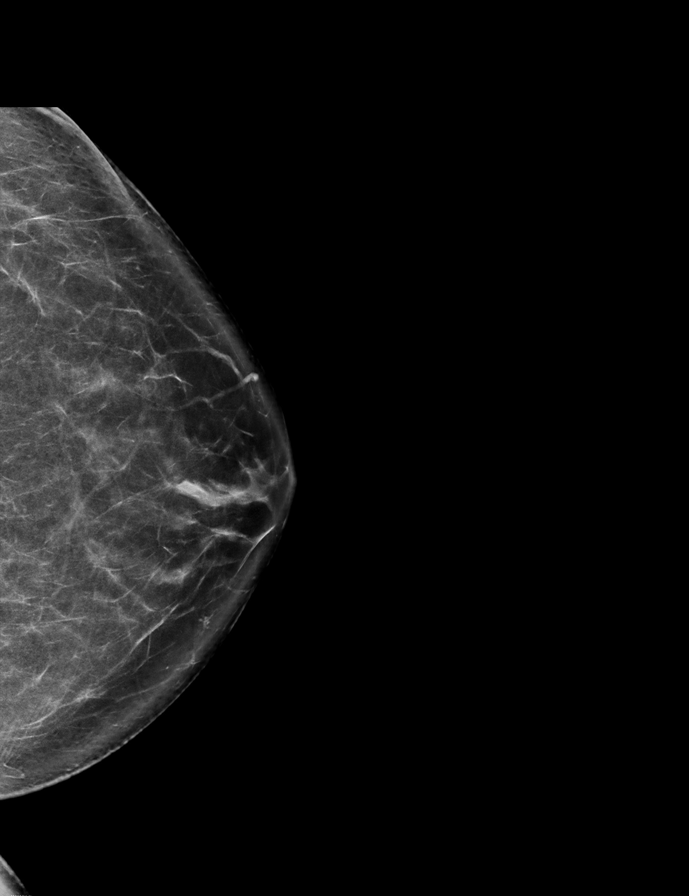

[R MLO synth-2D]
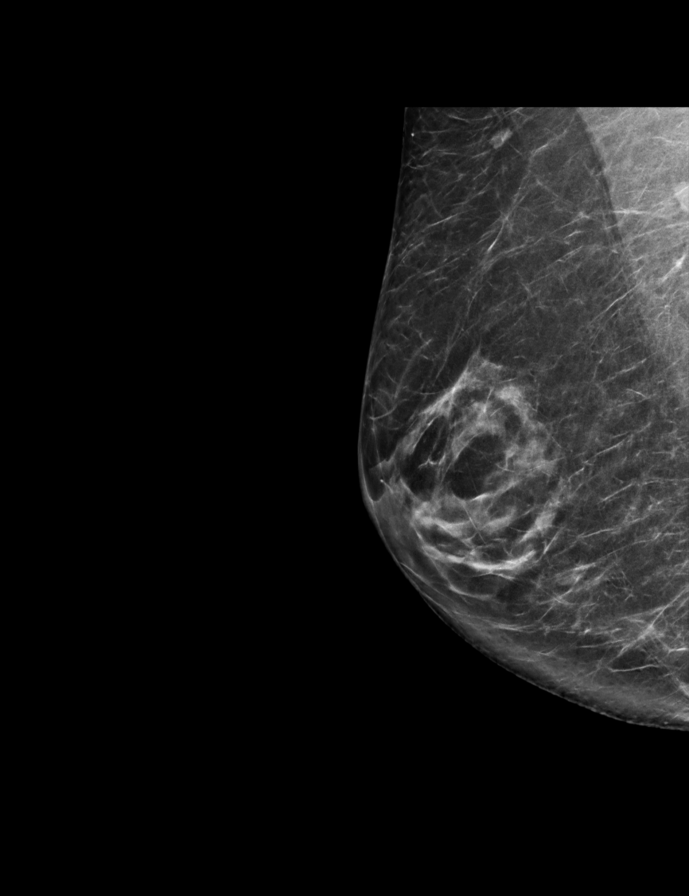

[L MLO synth-2D]
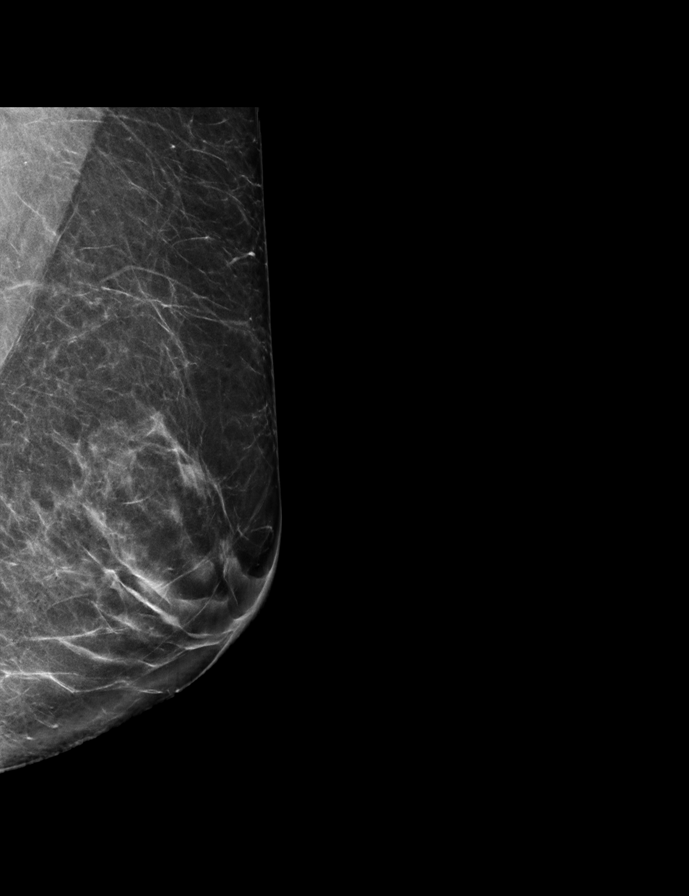

[R CC synth-2D]
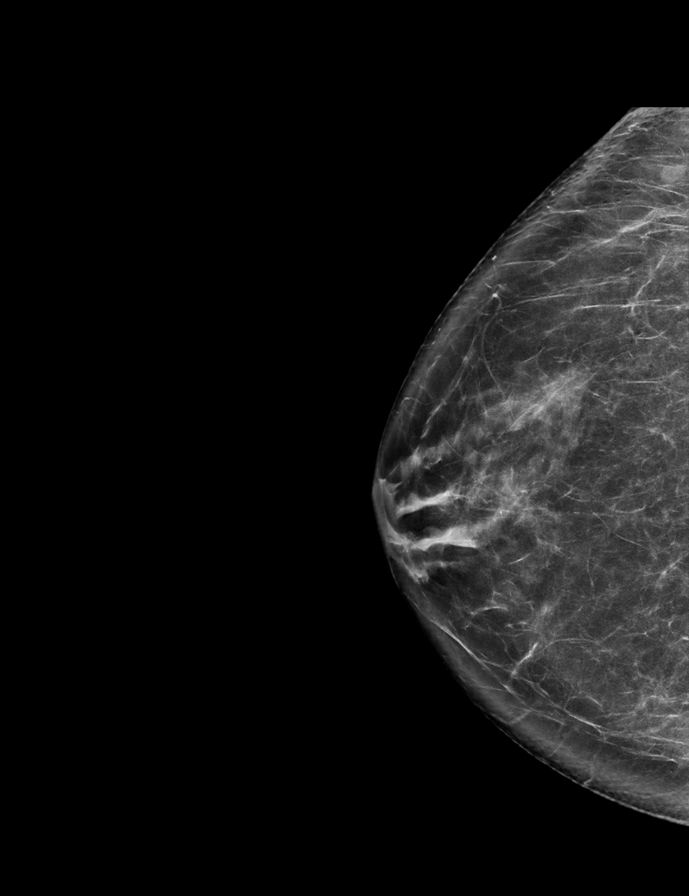

[R CC tomo · 2 of 73 frames shown]
[frame 24/73]
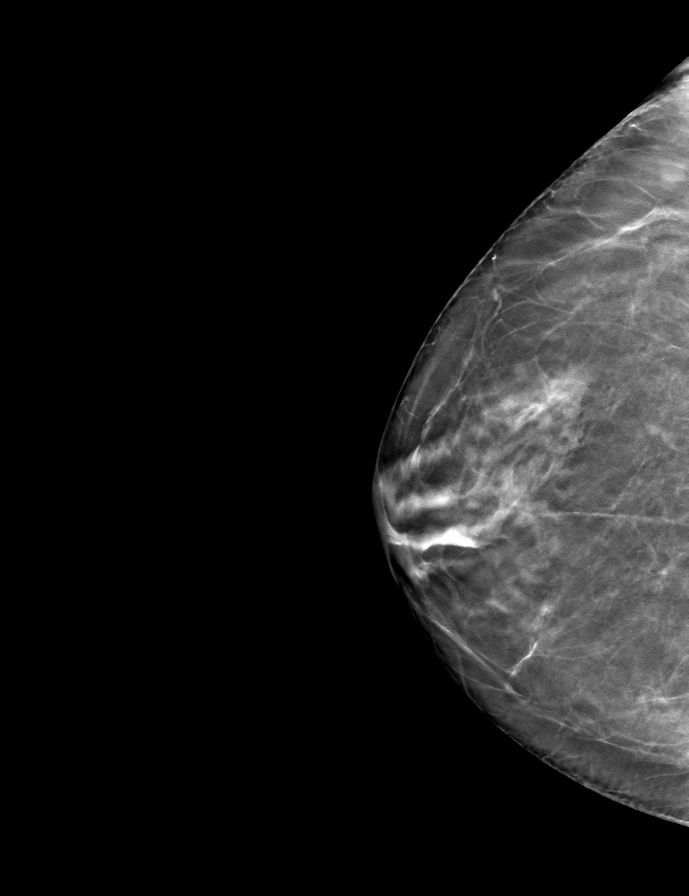
[frame 37/73]
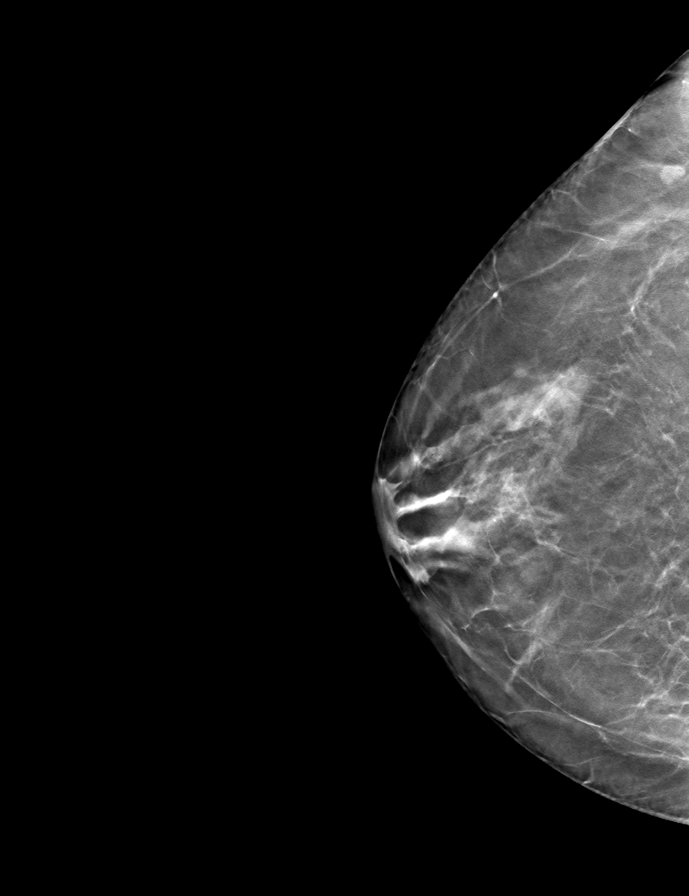

[L MLO tomo · tomo slice 37/72.0]
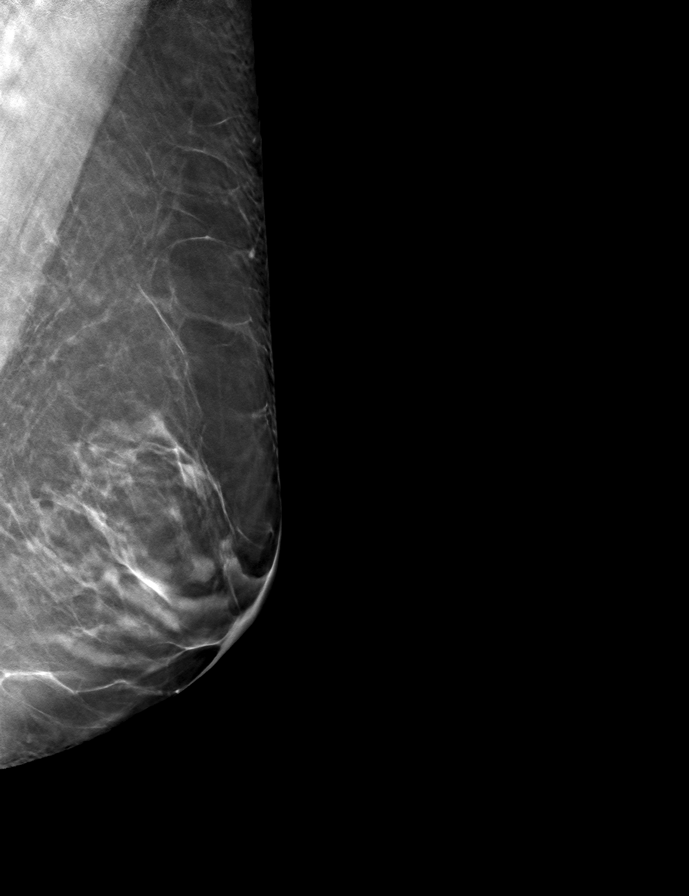

[L CC tomo · tomo slice 37/74.0]
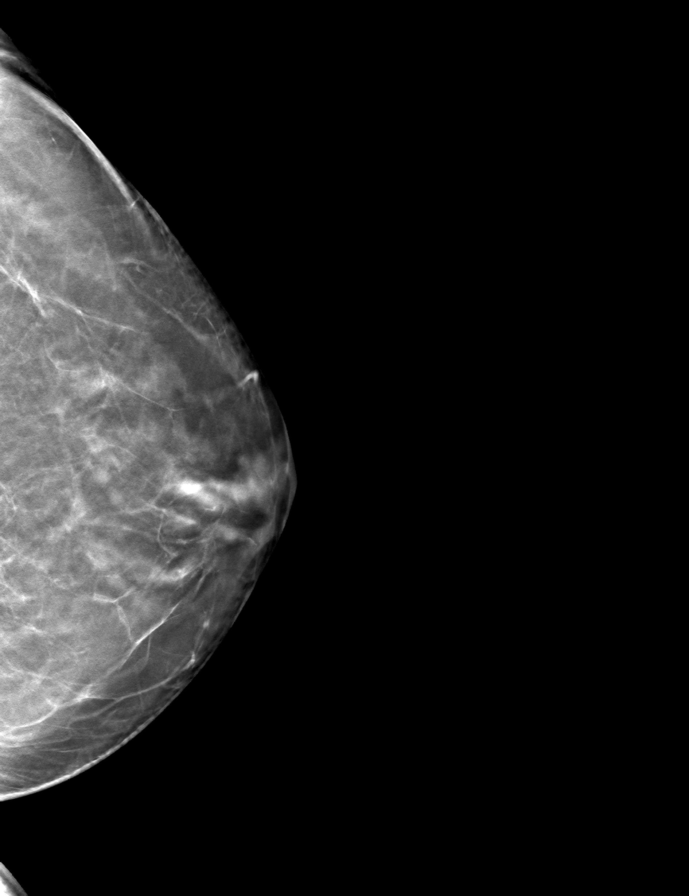

[R MLO tomo · tomo slice 37/74.0]
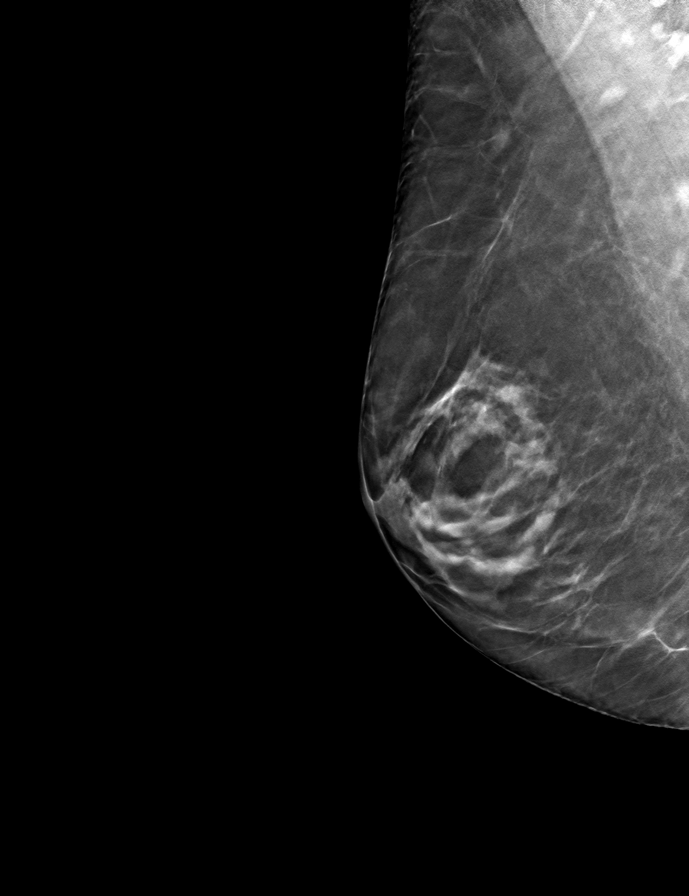

[9 of 24 positions shown; findings below may reference images not displayed]

ACR Breast Density Category b: There are scattered areas of
fibroglandular density.
FINDINGS: There are no findings suspicious for malignancy.
IMPRESSION: No mammographic evidence of malignancy. A result letter of this
screening mammogram will be mailed directly to the patient.

RECOMMENDATION:
Screening mammogram in one year. (Code:51-O-LD2)

BI-RADS CATEGORY  1: Negative.

## 2023-04-16 ENCOUNTER — Encounter: Payer: Self-pay | Admitting: Cardiovascular Disease

## 2023-04-22 ENCOUNTER — Other Ambulatory Visit: Payer: Self-pay

## 2023-04-22 MED ORDER — ROSUVASTATIN CALCIUM 40 MG PO TABS: 40.0000 mg | ORAL_TABLET | Freq: Every day | ORAL | 3 refills | Status: DC

## 2023-04-22 MED ORDER — EZETIMIBE 10 MG PO TABS: 10.0000 mg | ORAL_TABLET | Freq: Every day | ORAL | 3 refills | Status: DC

## 2023-05-16 ENCOUNTER — Other Ambulatory Visit: Payer: Self-pay | Admitting: Obstetrics

## 2023-05-16 DIAGNOSIS — M8580 Other specified disorders of bone density and structure, unspecified site: Secondary | ICD-10-CM

## 2023-06-13 ENCOUNTER — Other Ambulatory Visit: Payer: Self-pay | Admitting: Internal Medicine

## 2023-06-13 DIAGNOSIS — R1011 Right upper quadrant pain: Secondary | ICD-10-CM

## 2023-06-13 DIAGNOSIS — R1013 Epigastric pain: Secondary | ICD-10-CM

## 2023-06-16 ENCOUNTER — Ambulatory Visit
Admission: RE | Admit: 2023-06-16 | Discharge: 2023-06-16 | Disposition: A | Discharge status: Home / Self Care | Payer: Medicare Other | Source: Ambulatory Visit | Attending: Internal Medicine | Admitting: Internal Medicine

## 2023-06-16 DIAGNOSIS — R1011 Right upper quadrant pain: Secondary | ICD-10-CM

## 2023-06-16 DIAGNOSIS — R1013 Epigastric pain: Secondary | ICD-10-CM

## 2023-06-19 ENCOUNTER — Other Ambulatory Visit: Payer: Self-pay | Admitting: Internal Medicine

## 2023-06-19 DIAGNOSIS — K769 Liver disease, unspecified: Secondary | ICD-10-CM

## 2023-07-04 ENCOUNTER — Ambulatory Visit
Admission: RE | Admit: 2023-07-04 | Discharge: 2023-07-04 | Disposition: A | Discharge status: Home / Self Care | Payer: Medicare Other | Source: Ambulatory Visit | Attending: Internal Medicine | Admitting: Internal Medicine

## 2023-07-04 DIAGNOSIS — K769 Liver disease, unspecified: Secondary | ICD-10-CM

## 2023-07-04 MED ORDER — GADOPICLENOL 0.5 MMOL/ML IV SOLN
6.0000 mL | Freq: Once | INTRAVENOUS | Status: AC | PRN
  Administered 2023-07-04: 6 mL via INTRAVENOUS

## 2023-07-07 ENCOUNTER — Ambulatory Visit: Payer: Medicare Other | Admitting: Nurse Practitioner

## 2023-07-07 NOTE — Progress Notes (Deleted)
 Office Visit    Patient Name: Caroline Fowler Date of Encounter: 07/07/2023  Primary Care Provider:  Melida Quitter, MD Primary Cardiologist:  Reatha Harps, MD  Chief Complaint    68 year old female with a history of nonobstructive CAD, precordial chest pain, and hyperlipidemia who presents for follow-up related to CAD.  Past Medical History    Past Medical History:  Diagnosis Date   Coronary artery disease    Hyperlipidemia    Past Surgical History:  Procedure Laterality Date   cataract surgery Bilateral    SKIN SURGERY      Allergies  Allergies  Allergen Reactions   Penicillins Hives   Sulfa Antibiotics      Labs/Other Studies Reviewed    The following studies were reviewed today:  Cardiac Studies & Procedures   ______________________________________________________________________________________________     ECHOCARDIOGRAM  ECHOCARDIOGRAM COMPLETE 05/08/2020  Narrative ECHOCARDIOGRAM REPORT    Patient Name:   Caroline Fowler Date of Exam: 05/08/2020 Medical Rec #:  [id number]              Height:       69.0 in Accession #:    [id number]             Weight:       150.0 lb Date of Birth:  05-10-1956              BSA:          1.828 m Patient Age:    64 years               BP:           146/92 mmHg Patient Gender: F                      HR:           54 bpm. Exam Location:  [address]  Procedure: 2D Echo, 3D Echo, Cardiac Doppler and Color Doppler  Indications:    R94.31 Abnormal EKG  History:        Patient has no prior history of Echocardiogram examinations. CAD, Abnormal ECG; Risk Factors:Family History of Coronary Artery Disease, Hypertension and Dyslipidemia. Elevated Calcium Scoring.  Sonographer:    Farrel Conners RDCS Referring Phys: [id number] Ronnald Ramp O'NEAL  IMPRESSIONS   1. Left ventricular ejection fraction, by estimation, is 60 to 65%. The left ventricle has normal function. The left ventricle has no  regional wall motion abnormalities. Left ventricular diastolic parameters were normal. 2. Right ventricular systolic function is normal. The right ventricular size is normal. There is normal pulmonary artery systolic pressure. The estimated right ventricular systolic pressure is 29.9 mmHg. 3. The mitral valve is normal in structure. Trivial mitral valve regurgitation. 4. The aortic valve is tricuspid. Aortic valve regurgitation is not visualized. No aortic stenosis is present. 5. The inferior vena cava is dilated in size with >50% respiratory variability, suggesting right atrial pressure of 8 mmHg.  FINDINGS Left Ventricle: Left ventricular ejection fraction, by estimation, is 60 to 65%. The left ventricle has normal function. The left ventricle has no regional wall motion abnormalities. The left ventricular internal cavity size was normal in size. There is no left ventricular hypertrophy. Left ventricular diastolic parameters were normal.  Right Ventricle: The right ventricular size is normal. No increase in right ventricular wall thickness. Right ventricular systolic function is normal. There is normal pulmonary artery systolic pressure. The tricuspid regurgitant velocity is 2.34 m/s, and with  an assumed right atrial pressure of 8 mmHg, the estimated right ventricular systolic pressure is 29.9 mmHg.  Left Atrium: Left atrial size was normal in size.  Right Atrium: Right atrial size was normal in size.  Pericardium: There is no evidence of pericardial effusion.  Mitral Valve: The mitral valve is normal in structure. Trivial mitral valve regurgitation.  Tricuspid Valve: The tricuspid valve is normal in structure. Tricuspid valve regurgitation is trivial.  Aortic Valve: The aortic valve is tricuspid. Aortic valve regurgitation is not visualized. No aortic stenosis is present.  Pulmonic Valve: The pulmonic valve was not well visualized. Pulmonic valve regurgitation is trivial.  Aorta: The  aortic root and ascending aorta are structurally normal, with no evidence of dilitation.  Venous: The inferior vena cava is dilated in size with greater than 50% respiratory variability, suggesting right atrial pressure of 8 mmHg.  IAS/Shunts: The interatrial septum was not well visualized.   LEFT VENTRICLE PLAX 2D LVIDd:         3.90 cm  Diastology LVIDs:         2.57 cm  LV e' medial:    7.18 cm/s LV PW:         0.75 cm  LV E/e' medial:  8.1 LV IVS:        0.80 cm  LV e' lateral:   11.40 cm/s LVOT diam:     2.10 cm  LV E/e' lateral: 5.1 LV SV:         60 LV SV Index:   33 LVOT Area:     3.46 cm  3D Volume EF: 3D EF:        71 % LV EDV:       195 ml LV ESV:       56 ml LV SV:        139 ml  RIGHT VENTRICLE RV S prime:     11.80 cm/s TAPSE (M-mode): 2.4 cm  LEFT ATRIUM           Index       RIGHT ATRIUM           Index LA diam:      3.30 cm 1.81 cm/m  RA Area:     17.70 cm LA Vol (A2C): 27.9 ml 15.26 ml/m RA Volume:   48.00 ml  26.25 ml/m LA Vol (A4C): 48.3 ml 26.42 ml/m AORTIC VALVE LVOT Vmax:   58.70 cm/s LVOT Vmean:  40.000 cm/s LVOT VTI:    0.172 m  AORTA Ao Root diam: 3.30 cm Ao Asc diam:  3.20 cm  MITRAL VALVE               TRICUSPID VALVE MV Area (PHT): cm         TR Peak grad:   21.9 mmHg MV Decel Time: 439 msec    TR Vmax:        234.00 cm/s MV E velocity: 57.80 cm/s MV A velocity: 43.70 cm/s  SHUNTS MV E/A ratio:  1.32        Systemic VTI:  0.17 m Systemic Diam: 2.10 cm  Epifanio Lesches MD Electronically signed by Epifanio Lesches MD Signature Date/Time: 05/08/2020/12:02:21 PM    Final      CT SCANS  CT CORONARY MORPH W/CTA COR W/SCORE 07/23/2022  Addendum 07/25/2022  2:08 PM ADDENDUM REPORT: 07/25/2022 14:05  EXAM: OVER-READ INTERPRETATION  CT CHEST  The following report is an over-read performed by radiologist Dr. Alver Fisher [facility] on  07/25/2022. This over-read does not include interpretation of  cardiac or coronary anatomy or pathology. The interpretation by the cardiologist is attached.  COMPARISON:  09/01/2019.  FINDINGS: Scout view is unremarkable. Probable cysts in the liver. No acute extracardiac findings.  IMPRESSION: No acute extracardiac findings.   Electronically Signed By: Leanna Battles M.D. On: 07/25/2022 14:05  Narrative CLINICAL DATA:  68 yo female with chest pain  EXAM: Cardiac/Coronary CTA  TECHNIQUE: A non-contrast, gated CT scan was obtained with axial slices of 3 mm through the heart for calcium scoring. Calcium scoring was performed using the Agatston method. A 120 kV prospective, gated, contrast cardiac scan was obtained. Gantry rotation speed was 250 msecs and collimation was 0.6 mm. Two sublingual nitroglycerin tablets (0.8 mg) were given. The 3D data set was reconstructed in 5% intervals of the 35-75% of the R-R cycle. Diastolic phases were analyzed on a dedicated workstation using MPR, MIP, and VRT modes. The patient received 95 cc of contrast.  FINDINGS: Image quality: Average.  Noise artifact is: Moderate. (mis-registration).  Coronary Arteries:  Normal coronary origin.  Right dominance.  Left main: The left main is a large caliber vessel with a normal take off from the left coronary cusp that bifurcates to form a left anterior descending artery and a left circumflex artery. There is no plaque or stenosis.  Left anterior descending artery: The LAD has mild (25-49) calcified plaque in the proximal vessel. The LAD gives off 3 diagonal branches (large D2 and D3). There is mild (25-49) mixed plaque stenosis in the proximal D2.  Left circumflex artery: The LCX is non-dominant with minimal (0-24) calcified plaque in the proximal vessel. The LCX gives off 2 patent obtuse marginal branches.  Right coronary artery: The RCA is dominant with normal take off from the right coronary cusp. There is mild (25-49) mixed plaque stenosis in  the proximal vessel and minimal (0-24) calcified plaque in the distal vessel. The RCA terminates as a PDA and right posterolateral branch without evidence of plaque or stenosis.  Right Atrium: Right atrial size is within normal limits.  Right Ventricle: The right ventricular cavity is within normal limits.  Left Atrium: Left atrial size is normal in size with no left atrial appendage filling defect.  Left Ventricle: The ventricular cavity size is within normal limits. There are no stigmata of prior infarction. There is no abnormal filling defect.  Pulmonary arteries: Normal in size without proximal filling defect.  Pulmonary veins: Normal pulmonary venous drainage.  Pericardium: Normal thickness with no significant effusion or calcium present.  Cardiac valves: The aortic valve is trileaflet without significant calcification. The mitral valve is normal structure without significant calcification.  Aorta: Normal caliber with mild aortic atherosclerosis.  Extra-cardiac findings: See attached radiology report for non-cardiac structures.  IMPRESSION: 1. Coronary calcium score of 351. This was 32 percentile for age-, sex, and race-matched controls.  2. Normal coronary origin with right dominance.  3. Mild (25-49) stenoses in the proximal LAD, D2 and RCA.  4. Aortic atherosclerosis.  RECOMMENDATIONS: CAD-RADS 2: Mild non-obstructive CAD (25-49%). Consider non-atherosclerotic causes of chest pain. Consider preventive therapy and risk factor modification.  Olga Millers, MD  Electronically Signed: By: Olga Millers M.D. On: 07/23/2022 10:48   CT SCANS  CT CARDIAC SCORING (SELF PAY ONLY) 09/01/2019  Narrative CLINICAL DATA:  68 year old female with hyperlipidemia.  Caucasian  EXAM: CT CARDIAC CORONARY ARTERY CALCIUM SCORE  TECHNIQUE: Non-contrast imaging through the heart was performed using prospective ECG gating. Image  post processing was performed on  an independent workstation, allowing for quantitative analysis of the heart and coronary arteries. Note that this exam targets the heart and the chest was not imaged in its entirety.  COMPARISON:  None.  FINDINGS: Technical quality: Good  CORONARY CALCIUM SCORES:  Left Main: No coronary artery calcification  LAD: 164  LCx: 5.6  RCA: 87  CORONARY CALCIUM  Total Agatston Score: 256  MESA database percentile: 94  Ascending aorta (normal <  40 mm): 36 mm  EXTRACARDIAC FINDINGS:  Limited view of the lung parenchyma demonstrates a small LEFT upper lobe pulmonary nodule measuring 3 mm (image 17/9). Airways are normal.  Limited view of the mediastinum demonstrates no adenopathy. Esophagus normal.  Limited view of the upper abdomen unremarkable.  Limited view of the skeleton and chest wall is unremarkable.  IMPRESSION: 1. Three-vessel coronary artery calcification.  2. Total Agatston Score: 256  3. MESA age and sex matched database percentile: 19  4. Small LEFT lobe pulmonary nodule. No follow-up needed if patient is low-risk. Non-contrast chest CT can be considered in 12 months if patient is high-risk. This recommendation follows the consensus statement: Guidelines for Management of Incidental Pulmonary Nodules Detected on CT Images: From the Fleischner Society 2017; Radiology 2017; 284:228-243.  These results will be called to the ordering clinician or representative by the Radiologist Assistant, and communication documented in the PACS or [employer].   Electronically Signed By: Genevive Bi M.D. On: 09/01/2019 16:25     ______________________________________________________________________________________________     Recent Labs: 07/10/2022: BUN 13; Creatinine, Ser 0.88; Potassium 5.5; Sodium 142  Recent Lipid Panel No results found for: "CHOL", "TRIG", "HDL", "CHOLHDL", "VLDL", "LDLCALC", "LDLDIRECT"  History of Present Illness     68 year old female with the above past medical history including nonobstructive CAD, precordial chest pain, and hyperlipidemia.  Coronary artery calcium score in 2021 was 256 (94th percentile).  Echocardiogram in 2021 showed EF 60 to 65%, no RWMA, normal LV function, normal RV systolic function, no significant valvular abnormalities.  She was last seen in office on 07/10/2022 and was stable from a cardiac standpoint.  Coronary CT angiogram in 07/2022 revealed coronary calcium score 351 (93rd percentile), mild nonobstructive CAD.  She presents today for follow-up.  Since her last visit  Nonobstructive CAD: Hyperlipidemia: Disposition:  Home Medications    Current Outpatient Medications  Medication Sig Dispense Refill   BABY ASPIRIN PO Take 81 mg by mouth daily.     ezetimibe (ZETIA) 10 MG tablet Take 1 tablet (10 mg total) by mouth daily. 90 tablet 3   Fremanezumab-vfrm (AJOVY) 225 MG/1.5ML SOAJ Ajovy 225 mg/1.5 mL subcutaneous auto-injector  Inject by subcutaneous route for 90 days.     levothyroxine (SYNTHROID) 25 MCG tablet Take 25 mcg by mouth daily before breakfast.     metoprolol succinate (TOPROL-XL) 25 MG 24 hr tablet metoprolol succinate ER 25 mg tablet,extended release 24 hr  TAKE 2 TABLETS BY MOUTH EVERY DAY     rosuvastatin (CRESTOR) 40 MG tablet Take 1 tablet (40 mg total) by mouth daily. 90 tablet 3   sertraline (ZOLOFT) 100 MG tablet Take 100 mg by mouth daily.     SUMAtriptan (IMITREX) 100 MG tablet Take 100 mg by mouth as needed for migraine. May repeat in 2 hours if headache persists or recurs.     No current facility-administered medications for this visit.     Review of Systems    ***.  All other systems reviewed and  are otherwise negative except as noted above.    Physical Exam    VS:  There were no vitals taken for this visit. , BMI There is no height or weight on file to calculate BMI.     GEN: Well nourished, well developed, in no acute distress. HEENT:  normal. Neck: Supple, no JVD, carotid bruits, or masses. Cardiac: RRR, no murmurs, rubs, or gallops. No clubbing, cyanosis, edema.  Radials/DP/PT 2+ and equal bilaterally.  Respiratory:  Respirations regular and unlabored, clear to auscultation bilaterally. GI: Soft, nontender, nondistended, BS + x 4. MS: no deformity or atrophy. Skin: warm and dry, no rash. Neuro:  Strength and sensation are intact. Psych: Normal affect.  Accessory Clinical Findings    ECG personally reviewed by me today -    - no acute changes.   Lab Results  Component Value Date   HGB 13.8 06/07/2009   Lab Results  Component Value Date   CREATININE 0.88 07/10/2022   BUN 13 07/10/2022   NA 142 07/10/2022   K 5.5 (H) 07/10/2022   CL 103 07/10/2022   CO2 25 07/10/2022   No results found for: "ALT", "AST", "GGT", "ALKPHOS", "BILITOT" No results found for: "CHOL", "HDL", "LDLCALC", "LDLDIRECT", "TRIG", "CHOLHDL"  No results found for: "HGBA1C"  Assessment & Plan    1.  ***  No BP recorded.  {Refresh Note OR Click here to enter BP  :1}***   Joylene Grapes, NP 07/07/2023, 5:51 AM

## 2023-11-20 ENCOUNTER — Telehealth: Payer: Self-pay | Admitting: Cardiovascular Disease

## 2023-11-20 NOTE — Telephone Encounter (Signed)
 Called patient and made her an appointment to see PA. Informed her if he is not agreeable to virtual visit then we will call her back.

## 2023-11-20 NOTE — Telephone Encounter (Signed)
 Pt requesting video visit due to her having Chemo therapy.

## 2023-11-21 NOTE — Telephone Encounter (Signed)
 I reviewed Caroline Fowler's case.  She has minimal cardiac history, main reason she is being followed by Dr. Barbaraann is elevated coronary calcium  score.  She lives in the state of [place] .  I am fine with her using virtual visit.

## 2024-01-20 ENCOUNTER — Other Ambulatory Visit: Payer: Medicare Other

## 2024-01-23 ENCOUNTER — Ambulatory Visit: Attending: Physician Assistant | Admitting: Physician Assistant

## 2024-01-23 VITALS — Ht 69.0 in | Wt 144.0 lb

## 2024-01-23 DIAGNOSIS — E785 Hyperlipidemia, unspecified: Secondary | ICD-10-CM | POA: Diagnosis not present

## 2024-01-23 DIAGNOSIS — C787 Secondary malignant neoplasm of liver and intrahepatic bile duct: Secondary | ICD-10-CM | POA: Diagnosis not present

## 2024-01-23 DIAGNOSIS — C259 Malignant neoplasm of pancreas, unspecified: Secondary | ICD-10-CM | POA: Diagnosis not present

## 2024-01-23 DIAGNOSIS — I2510 Atherosclerotic heart disease of native coronary artery without angina pectoris: Secondary | ICD-10-CM | POA: Diagnosis not present

## 2024-01-23 MED ORDER — ROSUVASTATIN CALCIUM 20 MG PO TABS
20.0000 mg | ORAL_TABLET | Freq: Every day | ORAL | Status: DC
Start: 2024-01-23 — End: 2024-01-29

## 2024-01-23 NOTE — Progress Notes (Unsigned)
 Virtual Visit via Video Note   Because of Caroline Fowler's co-morbid illnesses, she is at least at moderate risk for complications without adequate follow up.  This format is felt to be most appropriate for this patient at this time.  All issues noted in this document were discussed and addressed.  A limited physical exam was performed with this format.  Please refer to the patient's chart for her consent to telehealth for [facility].       Date:  01/25/2024   ID:  Caroline Fowler, DOB [birth year], MRN [id number] The patient was identified using 2 identifiers.  Patient Location: Home Provider Location: Office/Clinic   PCP:  Stephane Leita DEL, MD    [facility] Providers Cardiologist:  Darryle ONEIDA Decent, MD     Evaluation Performed:  Follow-Up Visit  Chief Complaint:  1 year follow up  History of Present Illness:    Caroline Fowler is a 68 y.o. female with PMH of CAD, hyperlipidemia and history of pancreatic cancer.  She had a calcium  score of 256 which places her at the 94th percentile on previous CT image in April 2021.  Patient was last seen by Dr. Vernice in February 2024 at which time she described a fullness in her chest.  A coronary CTA was recommended and was obtained on 07/23/2022 which came back revealing coronary calcium  score 351 which placed the patient 93rd percentile for age and sex matched control, mild 25 to 49% stenosis in proximal LAD, D2 and RCA.  Risk factor modification was recommended.  MRI of the liver obtained in February 2025 also showed ill-defined soft tissue mass in the superior pancreatic neck measuring 2.5 x 2.0 cm, multiple rim-enhancing liver metastasis. She was ultimately diagnosed with pancreatic cancer with metastasis to the liver.  She has been followed by [facility] oncology service.  She has been treated with gemcitabine and abraxane chemotherapy.  She also developed anemia that was felt to be related to chemotherapy.   Patient presents today for virtual telephone visit.  Patient presents today for virtual video visit.  She denies any recent exertional chest pain or shortness of breath.  She is no longer on aspirin due to bleeding concern.  She is on rosuvastatin  20 mg daily and Zetia  10 mg daily.  She has a follow-up with her PCP next month to recheck her blood work.  Overall, she is doing well and can follow-up with cardiology service in 1 year.   Past Medical History:  Diagnosis Date   Coronary artery disease    Hyperlipidemia    Past Surgical History:  Procedure Laterality Date   cataract surgery Bilateral    SKIN SURGERY       Current Meds  Medication Sig   ezetimibe  (ZETIA ) 10 MG tablet Take 1 tablet (10 mg total) by mouth daily.   Fremanezumab-vfrm (AJOVY) 225 MG/1.5ML SOAJ Ajovy 225 mg/1.5 mL subcutaneous auto-injector  Inject by subcutaneous route for 90 days.   levothyroxine (SYNTHROID) 25 MCG tablet Take 25 mcg by mouth daily before breakfast.   metoprolol succinate (TOPROL-XL) 25 MG 24 hr tablet metoprolol succinate ER 25 mg tablet,extended release 24 hr  TAKE 2 TABLETS BY MOUTH EVERY DAY   rosuvastatin  (CRESTOR ) 20 MG tablet Take 1 tablet (20 mg total) by mouth daily.   sertraline (ZOLOFT) 100 MG tablet Take 100 mg by mouth daily.   SUMAtriptan (IMITREX) 100 MG tablet Take 100 mg by mouth as needed for migraine. May repeat in 2  hours if headache persists or recurs.     Allergies:   Penicillins and Sulfa antibiotics   Social History   Tobacco Use   Smoking status: Never   Smokeless tobacco: Never  Substance Use Topics   Alcohol use: Yes   Drug use: Never     Family Hx: The patient's family history includes Heart attack in her brother and father.  ROS:   Please see the history of present illness.     All other systems reviewed and are negative.   Prior CV studies:   The following studies were reviewed today:  Cardiac Studies & Procedures    ______________________________________________________________________________________________     ECHOCARDIOGRAM  ECHOCARDIOGRAM COMPLETE 05/08/2020  Narrative ECHOCARDIOGRAM REPORT    Patient Name:   Caroline Fowler Date of Exam: 05/08/2020 Medical Rec #:  [id number]              Height:       69.0 in Accession #:    [id number]             Weight:       150.0 lb Date of Birth:  [birth year]              BSA:          1.828 m Patient Age:    68 years               BP:           146/92 mmHg Patient Gender: F                      HR:           54 bpm. Exam Location:  [address]  Procedure: 2D Echo, 3D Echo, Cardiac Doppler and Color Doppler  Indications:    R94.31 Abnormal EKG  History:        Patient has no prior history of Echocardiogram examinations. CAD, Abnormal ECG; Risk Factors:Family History of Coronary Artery Disease, Hypertension and Dyslipidemia. Elevated Calcium  Scoring.  Sonographer:    Heather Hawks RDCS Referring Phys: [id number] DARRYLE NED O'NEAL  IMPRESSIONS   1. Left ventricular ejection fraction, by estimation, is 60 to 65%. The left ventricle has normal function. The left ventricle has no regional wall motion abnormalities. Left ventricular diastolic parameters were normal. 2. Right ventricular systolic function is normal. The right ventricular size is normal. There is normal pulmonary artery systolic pressure. The estimated right ventricular systolic pressure is 29.9 mmHg. 3. The mitral valve is normal in structure. Trivial mitral valve regurgitation. 4. The aortic valve is tricuspid. Aortic valve regurgitation is not visualized. No aortic stenosis is present. 5. The inferior vena cava is dilated in size with >50% respiratory variability, suggesting right atrial pressure of 8 mmHg.  FINDINGS Left Ventricle: Left ventricular ejection fraction, by estimation, is 60 to 65%. The left ventricle has normal function. The left ventricle has no  regional wall motion abnormalities. The left ventricular internal cavity size was normal in size. There is no left ventricular hypertrophy. Left ventricular diastolic parameters were normal.  Right Ventricle: The right ventricular size is normal. No increase in right ventricular wall thickness. Right ventricular systolic function is normal. There is normal pulmonary artery systolic pressure. The tricuspid regurgitant velocity is 2.34 m/s, and with an assumed right atrial pressure of 8 mmHg, the estimated right ventricular systolic pressure is 29.9 mmHg.  Left Atrium: Left atrial size was normal in size.  Right Atrium: Right atrial  size was normal in size.  Pericardium: There is no evidence of pericardial effusion.  Mitral Valve: The mitral valve is normal in structure. Trivial mitral valve regurgitation.  Tricuspid Valve: The tricuspid valve is normal in structure. Tricuspid valve regurgitation is trivial.  Aortic Valve: The aortic valve is tricuspid. Aortic valve regurgitation is not visualized. No aortic stenosis is present.  Pulmonic Valve: The pulmonic valve was not well visualized. Pulmonic valve regurgitation is trivial.  Aorta: The aortic root and ascending aorta are structurally normal, with no evidence of dilitation.  Venous: The inferior vena cava is dilated in size with greater than 50% respiratory variability, suggesting right atrial pressure of 8 mmHg.  IAS/Shunts: The interatrial septum was not well visualized.   LEFT VENTRICLE PLAX 2D LVIDd:         3.90 cm  Diastology LVIDs:         2.57 cm  LV e' medial:    7.18 cm/s LV PW:         0.75 cm  LV E/e' medial:  8.1 LV IVS:        0.80 cm  LV e' lateral:   11.40 cm/s LVOT diam:     2.10 cm  LV E/e' lateral: 5.1 LV SV:         60 LV SV Index:   33 LVOT Area:     3.46 cm  3D Volume EF: 3D EF:        71 % LV EDV:       195 ml LV ESV:       56 ml LV SV:        139 ml  RIGHT VENTRICLE RV S prime:     11.80  cm/s TAPSE (M-mode): 2.4 cm  LEFT ATRIUM           Index       RIGHT ATRIUM           Index LA diam:      3.30 cm 1.81 cm/m  RA Area:     17.70 cm LA Vol (A2C): 27.9 ml 15.26 ml/m RA Volume:   48.00 ml  26.25 ml/m LA Vol (A4C): 48.3 ml 26.42 ml/m AORTIC VALVE LVOT Vmax:   58.70 cm/s LVOT Vmean:  40.000 cm/s LVOT VTI:    0.172 m  AORTA Ao Root diam: 3.30 cm Ao Asc diam:  3.20 cm  MITRAL VALVE               TRICUSPID VALVE MV Area (PHT): cm         TR Peak grad:   21.9 mmHg MV Decel Time: 439 msec    TR Vmax:        234.00 cm/s MV E velocity: 57.80 cm/s MV A velocity: 43.70 cm/s  SHUNTS MV E/A ratio:  1.32        Systemic VTI:  0.17 m Systemic Diam: 2.10 cm  Lonni Nanas MD Electronically signed by Lonni Nanas MD Signature Date/Time: 05/08/2020/12:02:21 PM    Final      CT SCANS  CT CORONARY MORPH W/CTA COR W/SCORE 07/23/2022  Addendum 07/25/2022  2:08 PM ADDENDUM REPORT: 07/25/2022 14:05  EXAM: OVER-READ INTERPRETATION  CT CHEST  The following report is an over-read performed by radiologist Dr. Newell [facility]ma [facility] on 07/25/2022. This over-read does not include interpretation of cardiac or coronary anatomy or pathology. The interpretation by the cardiologist is attached.  COMPARISON:  09/01/2019.  FINDINGS: Scout view is unremarkable. Probable  cysts in the liver. No acute extracardiac findings.  IMPRESSION: No acute extracardiac findings.   Electronically Signed By: Newell Eke M.D. On: 07/25/2022 14:05  Narrative CLINICAL DATA:  67 yo female with chest pain  EXAM: Cardiac/Coronary CTA  TECHNIQUE: A non-contrast, gated CT scan was obtained with axial slices of 3 mm through the heart for calcium  scoring. Calcium  scoring was performed using the Agatston method. A 120 kV prospective, gated, contrast cardiac scan was obtained. Gantry rotation speed was 250 msecs and collimation was 0.6 mm. Two  sublingual nitroglycerin  tablets (0.8 mg) were given. The 3D data set was reconstructed in 5% intervals of the 35-75% of the R-R cycle. Diastolic phases were analyzed on a dedicated workstation using MPR, MIP, and VRT modes. The patient received 95 cc of contrast.  FINDINGS: Image quality: Average.  Noise artifact is: Moderate. (mis-registration).  Coronary Arteries:  Normal coronary origin.  Right dominance.  Left main: The left main is a large caliber vessel with a normal take off from the left coronary cusp that bifurcates to form a left anterior descending artery and a left circumflex artery. There is no plaque or stenosis.  Left anterior descending artery: The LAD has mild (25-49) calcified plaque in the proximal vessel. The LAD gives off 3 diagonal branches (large D2 and D3). There is mild (25-49) mixed plaque stenosis in the proximal D2.  Left circumflex artery: The LCX is non-dominant with minimal (0-24) calcified plaque in the proximal vessel. The LCX gives off 2 patent obtuse marginal branches.  Right coronary artery: The RCA is dominant with normal take off from the right coronary cusp. There is mild (25-49) mixed plaque stenosis in the proximal vessel and minimal (0-24) calcified plaque in the distal vessel. The RCA terminates as a PDA and right posterolateral branch without evidence of plaque or stenosis.  Right Atrium: Right atrial size is within normal limits.  Right Ventricle: The right ventricular cavity is within normal limits.  Left Atrium: Left atrial size is normal in size with no left atrial appendage filling defect.  Left Ventricle: The ventricular cavity size is within normal limits. There are no stigmata of prior infarction. There is no abnormal filling defect.  Pulmonary arteries: Normal in size without proximal filling defect.  Pulmonary veins: Normal pulmonary venous drainage.  Pericardium: Normal thickness with no significant effusion  or calcium  present.  Cardiac valves: The aortic valve is trileaflet without significant calcification. The mitral valve is normal structure without significant calcification.  Aorta: Normal caliber with mild aortic atherosclerosis.  Extra-cardiac findings: See attached radiology report for non-cardiac structures.  IMPRESSION: 1. Coronary calcium  score of 351. This was 80 percentile for age-, sex, and race-matched controls.  2. Normal coronary origin with right dominance.  3. Mild (25-49) stenoses in the proximal LAD, D2 and RCA.  4. Aortic atherosclerosis.  RECOMMENDATIONS: CAD-RADS 2: Mild non-obstructive CAD (25-49%). Consider non-atherosclerotic causes of chest pain. Consider preventive therapy and risk factor modification.  Redell Shallow, MD  Electronically Signed: By: Redell Shallow M.D. On: 07/23/2022 10:48   CT SCANS  CT CARDIAC SCORING (SELF PAY ONLY) 09/01/2019  Narrative CLINICAL DATA:  68 year old female with hyperlipidemia.  Caucasian  EXAM: CT CARDIAC CORONARY ARTERY CALCIUM  SCORE  TECHNIQUE: Non-contrast imaging through the heart was performed using prospective ECG gating. Image post processing was performed on an independent workstation, allowing for quantitative analysis of the heart and coronary arteries. Note that this exam targets the heart and the chest was not imaged in  its entirety.  COMPARISON:  None.  FINDINGS: Technical quality: Good  CORONARY CALCIUM  SCORES:  Left Main: No coronary artery calcification  LAD: 164  LCx: 5.6  RCA: 87  CORONARY CALCIUM   Total Agatston Score: 256  MESA database percentile: 94  Ascending aorta (normal <  40 mm): 36 mm  EXTRACARDIAC FINDINGS:  Limited view of the lung parenchyma demonstrates a small LEFT upper lobe pulmonary nodule measuring 3 mm (image 17/9). Airways are normal.  Limited view of the mediastinum demonstrates no adenopathy. Esophagus normal.  Limited view of the  upper abdomen unremarkable.  Limited view of the skeleton and chest wall is unremarkable.  IMPRESSION: 1. Three-vessel coronary artery calcification.  2. Total Agatston Score: 256  3. MESA age and sex matched database percentile: 42  4. Small LEFT lobe pulmonary nodule. No follow-up needed if patient is low-risk. Non-contrast chest CT can be considered in 12 months if patient is high-risk. This recommendation follows the consensus statement: Guidelines for Management of Incidental Pulmonary Nodules Detected on CT Images: From the Fleischner Society 2017; Radiology 2017; 284:228-243.  These results will be called to the ordering clinician or representative by the Radiologist Assistant, and communication documented in the PACS or [employer].   Electronically Signed By: Jackquline Boxer M.D. On: 09/01/2019 16:25     ______________________________________________________________________________________________       Labs/Other Tests and Data Reviewed:    EKG:  No ECG reviewed.  Recent Labs: No results found for requested labs within last 365 days.   Recent Lipid Panel No results found for: CHOL, TRIG, HDL, CHOLHDL, LDLCALC, LDLDIRECT  Wt Readings from Last 3 Encounters:  01/23/24 144 lb (65.3 kg)  07/10/22 152 lb 6.4 oz (69.1 kg)  06/20/21 148 lb 12.8 oz (67.5 kg)     Risk Assessment/Calculations:          Objective:    Vital Signs:  Ht 5' 9 (1.753 m)   Wt 144 lb (65.3 kg)   BMI 21.27 kg/m    VITAL SIGNS:  reviewed  ASSESSMENT & PLAN:    CAD: Prior history of nonobstructive CAD.  Patient denies any chest pain.  She is not on aspirin due to bleeding risk.  Continue rosuvastatin  20 mg daily  Hyperlipidemia: On rosuvastatin  20 mg daily and Zetia .  She has upcoming annual blood work by her PCP  History of metastatic pancreatic cancer: Followed by oncology service.  On chemotherapy.       Time:   Today, I have spent 13 minutes with  the patient with telehealth technology discussing the above problems.     Medication Adjustments/Labs and Tests Ordered: Current medicines are reviewed at length with the patient today.  Concerns regarding medicines are outlined above.   Tests Ordered: No orders of the defined types were placed in this encounter.   Medication Changes: Meds ordered this encounter  Medications   rosuvastatin  (CRESTOR ) 20 MG tablet    Sig: Take 1 tablet (20 mg total) by mouth daily.    Follow Up:  In Person in 1 year(s)  Signed, Scot Ford, [place]  01/25/2024 8:38 PM    [facility]

## 2024-01-23 NOTE — Patient Instructions (Signed)
 Thank you for choosing [facility]!     Medication Instructions:  No medication changes were made during today's visit.  *If you need a refill on your cardiac medications before your next appointment, please call your pharmacy*   Lab Work: No labs were ordered during today's visit.  If you have labs (blood work) drawn today and your tests are completely normal, you will receive your results only by: MyChart Message (if you have MyChart) OR A paper copy in the mail If you have any lab test that is abnormal or we need to change your treatment, we will call you to review the results.   Testing/Procedures: No procedures were ordered during today's visit.   Your next appointment:   1 year(s)   Provider:   Darryle ONEIDA Decent, MD     Follow-Up: At [facility], you and your health needs are our priority.  As part of our continuing mission to provide you with exceptional heart care, we have created designated Provider Care Teams.  These Care Teams include your primary Cardiologist (physician) and Advanced Practice Providers (APPs -  Physician Assistants and Nurse Practitioners) who all work together to provide you with the care you need, when you need it. We recommend signing up for the patient portal called MyChart.  Sign up information is provided on this After Visit Summary.  MyChart is used to connect with patients for Virtual Visits (Telemedicine).  Patients are able to view lab/test results, encounter notes, upcoming appointments, etc.  Non-urgent messages can be sent to your provider as well.   To learn more about what you can do with MyChart, go to [email or web address].

## 2024-01-29 ENCOUNTER — Telehealth: Payer: Self-pay | Admitting: Cardiovascular Disease

## 2024-01-29 MED ORDER — ROSUVASTATIN CALCIUM 20 MG PO TABS: 20.0000 mg | ORAL_TABLET | Freq: Every day | ORAL | 3 refills | Status: DC

## 2024-01-29 NOTE — Telephone Encounter (Signed)
*  STAT* If patient is at the pharmacy, call can be transferred to refill team.   1. Which medications need to be refilled? (please list name of each medication and dose if known)  rosuvastatin  (CRESTOR ) 20 MG tablet   2. Which pharmacy/location (including street and city if local pharmacy) is medication to be sent to? [id number] - [place], [place] - [address] AT [address] OF [address]    3. Do they need a 30 day or 90 day supply?  90 day supply

## 2024-01-29 NOTE — Telephone Encounter (Signed)
 Pt's medication was sent to pt's pharmacy as requested. Confirmation received.

## 2024-06-03 ENCOUNTER — Emergency Department (HOSPITAL_COMMUNITY)

## 2024-06-03 ENCOUNTER — Other Ambulatory Visit: Payer: Self-pay

## 2024-06-03 ENCOUNTER — Encounter (HOSPITAL_COMMUNITY): Payer: Self-pay | Admitting: Internal Medicine

## 2024-06-03 ENCOUNTER — Inpatient Hospital Stay (HOSPITAL_COMMUNITY)
Admission: EM | Admit: 2024-06-03 | Discharge: 2024-06-06 | DRG: 291 | Disposition: A | Attending: Family Medicine | Admitting: Family Medicine

## 2024-06-03 DIAGNOSIS — K219 Gastro-esophageal reflux disease without esophagitis: Secondary | ICD-10-CM | POA: Diagnosis present

## 2024-06-03 DIAGNOSIS — Z79890 Hormone replacement therapy (postmenopausal): Secondary | ICD-10-CM

## 2024-06-03 DIAGNOSIS — I161 Hypertensive emergency: Secondary | ICD-10-CM | POA: Diagnosis present

## 2024-06-03 DIAGNOSIS — Z882 Allergy status to sulfonamides status: Secondary | ICD-10-CM

## 2024-06-03 DIAGNOSIS — J9601 Acute respiratory failure with hypoxia: Secondary | ICD-10-CM | POA: Diagnosis present

## 2024-06-03 DIAGNOSIS — Z87891 Personal history of nicotine dependence: Secondary | ICD-10-CM

## 2024-06-03 DIAGNOSIS — Z8249 Family history of ischemic heart disease and other diseases of the circulatory system: Secondary | ICD-10-CM

## 2024-06-03 DIAGNOSIS — F32A Depression, unspecified: Secondary | ICD-10-CM | POA: Diagnosis present

## 2024-06-03 DIAGNOSIS — E039 Hypothyroidism, unspecified: Secondary | ICD-10-CM | POA: Diagnosis present

## 2024-06-03 DIAGNOSIS — E8770 Fluid overload, unspecified: Secondary | ICD-10-CM | POA: Diagnosis not present

## 2024-06-03 DIAGNOSIS — I5033 Acute on chronic diastolic (congestive) heart failure: Secondary | ICD-10-CM | POA: Diagnosis present

## 2024-06-03 DIAGNOSIS — R7989 Other specified abnormal findings of blood chemistry: Secondary | ICD-10-CM | POA: Diagnosis present

## 2024-06-03 DIAGNOSIS — I110 Hypertensive heart disease with heart failure: Secondary | ICD-10-CM | POA: Diagnosis present

## 2024-06-03 DIAGNOSIS — I272 Pulmonary hypertension, unspecified: Secondary | ICD-10-CM | POA: Diagnosis present

## 2024-06-03 DIAGNOSIS — Z1623 Resistance to quinolones and fluoroquinolones: Secondary | ICD-10-CM | POA: Diagnosis present

## 2024-06-03 DIAGNOSIS — N179 Acute kidney failure, unspecified: Secondary | ICD-10-CM | POA: Diagnosis present

## 2024-06-03 DIAGNOSIS — F419 Anxiety disorder, unspecified: Secondary | ICD-10-CM | POA: Diagnosis present

## 2024-06-03 DIAGNOSIS — Z66 Do not resuscitate: Secondary | ICD-10-CM | POA: Diagnosis present

## 2024-06-03 DIAGNOSIS — J918 Pleural effusion in other conditions classified elsewhere: Secondary | ICD-10-CM | POA: Diagnosis present

## 2024-06-03 DIAGNOSIS — N390 Urinary tract infection, site not specified: Secondary | ICD-10-CM | POA: Diagnosis present

## 2024-06-03 DIAGNOSIS — G4700 Insomnia, unspecified: Secondary | ICD-10-CM | POA: Diagnosis present

## 2024-06-03 DIAGNOSIS — E785 Hyperlipidemia, unspecified: Secondary | ICD-10-CM | POA: Diagnosis present

## 2024-06-03 DIAGNOSIS — G620 Drug-induced polyneuropathy: Secondary | ICD-10-CM | POA: Diagnosis present

## 2024-06-03 DIAGNOSIS — C259 Malignant neoplasm of pancreas, unspecified: Secondary | ICD-10-CM | POA: Diagnosis present

## 2024-06-03 DIAGNOSIS — I2510 Atherosclerotic heart disease of native coronary artery without angina pectoris: Secondary | ICD-10-CM | POA: Diagnosis present

## 2024-06-03 DIAGNOSIS — K30 Functional dyspepsia: Secondary | ICD-10-CM | POA: Diagnosis present

## 2024-06-03 DIAGNOSIS — G43909 Migraine, unspecified, not intractable, without status migrainosus: Secondary | ICD-10-CM | POA: Diagnosis present

## 2024-06-03 DIAGNOSIS — I3139 Other pericardial effusion (noninflammatory): Secondary | ICD-10-CM | POA: Diagnosis present

## 2024-06-03 DIAGNOSIS — J810 Acute pulmonary edema: Secondary | ICD-10-CM | POA: Diagnosis present

## 2024-06-03 DIAGNOSIS — I2489 Other forms of acute ischemic heart disease: Secondary | ICD-10-CM | POA: Diagnosis present

## 2024-06-03 DIAGNOSIS — G893 Neoplasm related pain (acute) (chronic): Secondary | ICD-10-CM | POA: Diagnosis present

## 2024-06-03 DIAGNOSIS — Z8507 Personal history of malignant neoplasm of pancreas: Secondary | ICD-10-CM

## 2024-06-03 DIAGNOSIS — E871 Hypo-osmolality and hyponatremia: Secondary | ICD-10-CM | POA: Diagnosis present

## 2024-06-03 DIAGNOSIS — C787 Secondary malignant neoplasm of liver and intrahepatic bile duct: Secondary | ICD-10-CM | POA: Diagnosis present

## 2024-06-03 DIAGNOSIS — I10 Essential (primary) hypertension: Secondary | ICD-10-CM | POA: Diagnosis present

## 2024-06-03 DIAGNOSIS — Z79899 Other long term (current) drug therapy: Secondary | ICD-10-CM

## 2024-06-03 DIAGNOSIS — J811 Chronic pulmonary edema: Secondary | ICD-10-CM | POA: Diagnosis present

## 2024-06-03 DIAGNOSIS — Z880 Allergy status to penicillin: Secondary | ICD-10-CM

## 2024-06-03 DIAGNOSIS — T451X5A Adverse effect of antineoplastic and immunosuppressive drugs, initial encounter: Secondary | ICD-10-CM | POA: Diagnosis present

## 2024-06-03 LAB — TROPONIN T, HIGH SENSITIVITY
Troponin T High Sensitivity: 40 ng/L — ABNORMAL HIGH (ref 0–19)
Troponin T High Sensitivity: 80 ng/L — ABNORMAL HIGH (ref 0–19)
Troponin T High Sensitivity: 98 ng/L — ABNORMAL HIGH (ref 0–19)

## 2024-06-03 LAB — CBC WITH DIFFERENTIAL/PLATELET
Abs Immature Granulocytes: 0.04 K/uL (ref 0.00–0.07)
Basophils Absolute: 0 K/uL (ref 0.0–0.1)
Basophils Relative: 0 %
Eosinophils Absolute: 0 K/uL (ref 0.0–0.5)
Eosinophils Relative: 0 %
HCT: 30.3 % — ABNORMAL LOW (ref 36.0–46.0)
Hemoglobin: 10.4 g/dL — ABNORMAL LOW (ref 12.0–15.0)
Immature Granulocytes: 0 %
Lymphocytes Relative: 13 %
Lymphs Abs: 1.2 K/uL (ref 0.7–4.0)
MCH: 31.8 pg (ref 26.0–34.0)
MCHC: 34.3 g/dL (ref 30.0–36.0)
MCV: 92.7 fL (ref 80.0–100.0)
Monocytes Absolute: 1.4 K/uL — ABNORMAL HIGH (ref 0.1–1.0)
Monocytes Relative: 15 %
Neutro Abs: 6.6 K/uL (ref 1.7–7.7)
Neutrophils Relative %: 72 %
Platelets: 580 K/uL — ABNORMAL HIGH (ref 150–400)
RBC: 3.27 MIL/uL — ABNORMAL LOW (ref 3.87–5.11)
RDW: 18.2 % — ABNORMAL HIGH (ref 11.5–15.5)
WBC: 9.2 K/uL (ref 4.0–10.5)
nRBC: 0.2 % (ref 0.0–0.2)

## 2024-06-03 LAB — BASIC METABOLIC PANEL WITH GFR
Anion gap: 10 (ref 5–15)
BUN: 21 mg/dL (ref 8–23)
CO2: 23 mmol/L (ref 22–32)
Calcium: 8.3 mg/dL — ABNORMAL LOW (ref 8.9–10.3)
Chloride: 98 mmol/L (ref 98–111)
Creatinine, Ser: 1.89 mg/dL — ABNORMAL HIGH (ref 0.44–1.00)
GFR, Estimated: 29 mL/min — ABNORMAL LOW
Glucose, Bld: 135 mg/dL — ABNORMAL HIGH (ref 70–99)
Potassium: 4 mmol/L (ref 3.5–5.1)
Sodium: 131 mmol/L — ABNORMAL LOW (ref 135–145)

## 2024-06-03 LAB — COMPREHENSIVE METABOLIC PANEL WITH GFR
ALT: 22 U/L (ref 0–44)
AST: 36 U/L (ref 15–41)
Albumin: 3.6 g/dL (ref 3.5–5.0)
Alkaline Phosphatase: 71 U/L (ref 38–126)
Anion gap: 13 (ref 5–15)
BUN: 20 mg/dL (ref 8–23)
CO2: 20 mmol/L — ABNORMAL LOW (ref 22–32)
Calcium: 8.6 mg/dL — ABNORMAL LOW (ref 8.9–10.3)
Chloride: 97 mmol/L — ABNORMAL LOW (ref 98–111)
Creatinine, Ser: 1.84 mg/dL — ABNORMAL HIGH (ref 0.44–1.00)
GFR, Estimated: 30 mL/min — ABNORMAL LOW
Glucose, Bld: 110 mg/dL — ABNORMAL HIGH (ref 70–99)
Potassium: 4.4 mmol/L (ref 3.5–5.1)
Sodium: 129 mmol/L — ABNORMAL LOW (ref 135–145)
Total Bilirubin: 0.5 mg/dL (ref 0.0–1.2)
Total Protein: 5.7 g/dL — ABNORMAL LOW (ref 6.5–8.1)

## 2024-06-03 LAB — I-STAT CHEM 8, ED
BUN: 20 mg/dL (ref 8–23)
Calcium, Ion: 1.13 mmol/L — ABNORMAL LOW (ref 1.15–1.40)
Chloride: 98 mmol/L (ref 98–111)
Creatinine, Ser: 2 mg/dL — ABNORMAL HIGH (ref 0.44–1.00)
Glucose, Bld: 106 mg/dL — ABNORMAL HIGH (ref 70–99)
HCT: 31 % — ABNORMAL LOW (ref 36.0–46.0)
Hemoglobin: 10.5 g/dL — ABNORMAL LOW (ref 12.0–15.0)
Potassium: 4.3 mmol/L (ref 3.5–5.1)
Sodium: 130 mmol/L — ABNORMAL LOW (ref 135–145)
TCO2: 21 mmol/L — ABNORMAL LOW (ref 22–32)

## 2024-06-03 LAB — PRO BRAIN NATRIURETIC PEPTIDE: Pro Brain Natriuretic Peptide: 17383 pg/mL — ABNORMAL HIGH

## 2024-06-03 MED ORDER — ALBUTEROL SULFATE (2.5 MG/3ML) 0.083% IN NEBU
2.5000 mg | INHALATION_SOLUTION | Freq: Four times a day (QID) | RESPIRATORY_TRACT | Status: DC | PRN
  Administered 2024-06-03: 2.5 mg via RESPIRATORY_TRACT
  Filled 2024-06-03: qty 3

## 2024-06-03 MED ORDER — CHLORHEXIDINE GLUCONATE CLOTH 2 % EX PADS
6.0000 | MEDICATED_PAD | Freq: Every day | CUTANEOUS | Status: DC
  Administered 2024-06-04 – 2024-06-06 (×3): 6 via TOPICAL

## 2024-06-03 MED ORDER — AMLODIPINE BESYLATE 5 MG PO TABS
10.0000 mg | ORAL_TABLET | Freq: Every day | ORAL | Status: DC
  Administered 2024-06-03 – 2024-06-06 (×4): 10 mg via ORAL
  Filled 2024-06-03 (×4): qty 2

## 2024-06-03 MED ORDER — HYDRALAZINE HCL 20 MG/ML IJ SOLN
10.0000 mg | INTRAMUSCULAR | Status: DC | PRN
  Administered 2024-06-04: 10 mg via INTRAVENOUS
  Filled 2024-06-03: qty 1

## 2024-06-03 MED ORDER — ZOLPIDEM TARTRATE 5 MG PO TABS
10.0000 mg | ORAL_TABLET | Freq: Every evening | ORAL | Status: DC | PRN
  Administered 2024-06-04: 10 mg via ORAL
  Filled 2024-06-03: qty 2

## 2024-06-03 MED ORDER — OXYCODONE HCL 5 MG PO TABS: 5.0000 mg | ORAL_TABLET | ORAL | Status: DC | PRN

## 2024-06-03 MED ORDER — FUROSEMIDE 10 MG/ML IJ SOLN
80.0000 mg | Freq: Two times a day (BID) | INTRAMUSCULAR | Status: DC
  Administered 2024-06-03: 80 mg via INTRAVENOUS
  Filled 2024-06-03: qty 8

## 2024-06-03 MED ORDER — FUROSEMIDE 10 MG/ML IJ SOLN
40.0000 mg | Freq: Once | INTRAMUSCULAR | Status: AC
  Administered 2024-06-03: 40 mg via INTRAVENOUS
  Filled 2024-06-03: qty 4

## 2024-06-03 MED ORDER — SODIUM CHLORIDE 0.9% FLUSH
10.0000 mL | Freq: Two times a day (BID) | INTRAVENOUS | Status: DC
  Administered 2024-06-03 – 2024-06-06 (×6): 10 mL

## 2024-06-03 MED ORDER — RIMEGEPANT SULFATE 75 MG PO TBDP
75.0000 mg | ORAL_TABLET | Freq: Every day | ORAL | Status: DC | PRN
  Filled 2024-06-03: qty 1

## 2024-06-03 MED ORDER — PANTOPRAZOLE SODIUM 40 MG PO TBEC
40.0000 mg | DELAYED_RELEASE_TABLET | Freq: Every day | ORAL | Status: DC
  Administered 2024-06-03 – 2024-06-06 (×4): 40 mg via ORAL
  Filled 2024-06-03 (×4): qty 1

## 2024-06-03 MED ORDER — EZETIMIBE 10 MG PO TABS
10.0000 mg | ORAL_TABLET | Freq: Every day | ORAL | Status: DC
  Administered 2024-06-03 – 2024-06-06 (×4): 10 mg via ORAL
  Filled 2024-06-03 (×4): qty 1

## 2024-06-03 MED ORDER — METOCLOPRAMIDE HCL 5 MG/ML IJ SOLN
10.0000 mg | Freq: Once | INTRAMUSCULAR | Status: DC
  Filled 2024-06-03: qty 2

## 2024-06-03 MED ORDER — DIPHENHYDRAMINE HCL 50 MG/ML IJ SOLN
12.5000 mg | Freq: Once | INTRAMUSCULAR | Status: DC
  Filled 2024-06-03: qty 1

## 2024-06-03 MED ORDER — ROSUVASTATIN CALCIUM 20 MG PO TABS
20.0000 mg | ORAL_TABLET | Freq: Every day | ORAL | Status: DC
  Administered 2024-06-03 – 2024-06-06 (×4): 20 mg via ORAL
  Filled 2024-06-03 (×3): qty 1

## 2024-06-03 MED ORDER — CEFAZOLIN SODIUM-DEXTROSE 1-4 GM/50ML-% IV SOLN
1.0000 g | Freq: Three times a day (TID) | INTRAVENOUS | Status: AC
  Administered 2024-06-03 – 2024-06-06 (×10): 1 g via INTRAVENOUS
  Filled 2024-06-03 (×11): qty 50

## 2024-06-03 MED ORDER — BUTALBITAL-APAP-CAFFEINE 50-325-40 MG PO TABS: 1.0000 | ORAL_TABLET | Freq: Four times a day (QID) | ORAL | Status: DC | PRN

## 2024-06-03 MED ORDER — LORAZEPAM 0.5 MG PO TABS: 0.5000 mg | ORAL_TABLET | Freq: Two times a day (BID) | ORAL | Status: DC | PRN

## 2024-06-03 MED ORDER — ONDANSETRON HCL 4 MG/2ML IJ SOLN: 4.0000 mg | Freq: Four times a day (QID) | INTRAMUSCULAR | Status: DC | PRN

## 2024-06-03 MED ORDER — PREDNISONE 20 MG PO TABS
30.0000 mg | ORAL_TABLET | Freq: Every day | ORAL | Status: DC
  Administered 2024-06-03 – 2024-06-06 (×4): 30 mg via ORAL
  Filled 2024-06-03 (×4): qty 1

## 2024-06-03 MED ORDER — HYDRALAZINE HCL 25 MG PO TABS
25.0000 mg | ORAL_TABLET | Freq: Three times a day (TID) | ORAL | Status: DC
  Administered 2024-06-03 – 2024-06-04 (×3): 25 mg via ORAL
  Filled 2024-06-03 (×3): qty 1

## 2024-06-03 MED ORDER — SERTRALINE HCL 100 MG PO TABS
200.0000 mg | ORAL_TABLET | Freq: Every day | ORAL | Status: DC
  Administered 2024-06-03 – 2024-06-05 (×3): 200 mg via ORAL
  Filled 2024-06-03 (×3): qty 2

## 2024-06-03 MED ORDER — ENOXAPARIN SODIUM 40 MG/0.4ML IJ SOSY: 40.0000 mg | PREFILLED_SYRINGE | INTRAMUSCULAR | Status: DC

## 2024-06-03 MED ORDER — FUROSEMIDE 10 MG/ML IJ SOLN: 40.0000 mg | Freq: Two times a day (BID) | INTRAMUSCULAR | Status: DC

## 2024-06-03 MED ORDER — BUSPIRONE HCL 5 MG PO TABS
15.0000 mg | ORAL_TABLET | Freq: Two times a day (BID) | ORAL | Status: DC
  Administered 2024-06-03 – 2024-06-06 (×6): 15 mg via ORAL
  Filled 2024-06-03 (×6): qty 1

## 2024-06-03 MED ORDER — METOPROLOL SUCCINATE ER 50 MG PO TB24
50.0000 mg | ORAL_TABLET | Freq: Every day | ORAL | Status: DC
  Administered 2024-06-03 – 2024-06-04 (×2): 50 mg via ORAL
  Filled 2024-06-03 (×2): qty 1

## 2024-06-03 MED ORDER — ONDANSETRON HCL 4 MG PO TABS: 4.0000 mg | ORAL_TABLET | Freq: Four times a day (QID) | ORAL | Status: DC | PRN

## 2024-06-03 MED ORDER — LEVOTHYROXINE SODIUM 25 MCG PO TABS
25.0000 ug | ORAL_TABLET | Freq: Every day | ORAL | Status: DC
  Administered 2024-06-04 – 2024-06-06 (×3): 25 ug via ORAL
  Filled 2024-06-03 (×3): qty 1

## 2024-06-03 MED ORDER — NITROGLYCERIN 2 % TD OINT
1.0000 [in_us] | TOPICAL_OINTMENT | Freq: Once | TRANSDERMAL | Status: AC
  Administered 2024-06-03: 1 [in_us] via TOPICAL
  Filled 2024-06-03: qty 1

## 2024-06-03 MED ORDER — ACETAMINOPHEN 325 MG PO TABS: 650.0000 mg | ORAL_TABLET | Freq: Four times a day (QID) | ORAL | Status: DC | PRN

## 2024-06-03 MED ORDER — ACETAMINOPHEN 650 MG RE SUPP: 650.0000 mg | Freq: Four times a day (QID) | RECTAL | Status: DC | PRN

## 2024-06-03 NOTE — Consult Note (Signed)
 "  Cardiology Consultation   Patient ID: Alanda Colton MRN: [id number]; DOB: Jan 03, 1956  Admit date: 06/03/2024 Date of Consult: 06/03/2024  PCP:  Stephane Leita DEL, MD    [facility] Providers Cardiologist:  Darryle ONEIDA Decent, MD        Patient Profile: Ceylin Dreibelbis is a 69 y.o. female with a hx of pancreatic cancer with metastases to the liver, hyperlipidemia, history of migraines, CAD with a coronary calcium  score of 351 who is being seen 06/03/2024 for the evaluation of elevated troponins at the request of Burnard Cunning DO.  History of Present Illness: Ms. Karney is a 69 year old female with prior cardiac history listed below.  She was initially seen in the clinic on 04/2020 following an elevated coronary calcium  score.  Because of this elevated coronary calcium  she was started on Crestor  20 mg daily.  An echocardiogram was ordered for concerns of an anterior septal infarct.  Echocardiogram showed a normal LVEF of 60 to 65%, no RWMA, RV systolic pressure of 29.9 mmHg, normal RV function, and trivial MR.  Patient was seen in the clinic by Dr. Decent on 06/2022.  At that appointment she reported having some chest tightness with exertion.  Because of this a coronary CT was done on 07/2022 and showed a coronary calcium  score of 351 (93rd percentile) with mild stenosis in the proximal LAD, D2, and RCA.  A MRI of the liver on 06/2023 showed a mass on the superior pancreatic neck with multiple liver metastases.  The patient was diagnosed with pancreatic cancer with metastases to the liver.  She has been followed by oncology at [facility].  She was no longer on aspirin due to bleeding concerns.  Patient was previously on gemcitabine this has been held due to worsening edema, fatigue, and shortness of breath   Her lipids were managed using Crestor  20 mg daily and Zetia  10 mg daily.  Home blood pressure management includes amlodipine  5 mg daily, clonidine daily as needed, and olmesartan  20 mg daily.  Hydrochlorothiazide was stopped due to hyponatremia.  On 05/27/2024 a CT chest abdomen and pelvis was performed and showed stable hepatic metastases, and new pleural effusions concerning for additional metastatic disease. On 05/28/2024 the patient received a blood transfusion at [facility] for a hemoglobin of 6.9. On 06/01/2024 patient had a proBNP of 24,284 at [facility].  A TTE was performed yesterday at [facility] showed an LVEF of 50%, abnormal longitudinal strain -16, moderate concentric LVH, G1DD, mild pericardial effusion, pleural effusion, normal RV function, moderate MR, mild PR, mild TR, and moderate pulmonary hypertension  Patient presented to the hospital for concerns of worsening shortness of breath.  On interview patient reported that she has had worsening shortness of breath over the past 3 weeks.  Reported that she has also had some worsening lower extremity edema.  Reported that the shortness of breath this morning was more severe than it was prior to that.  Had some PND last night as well.  Denies any orthopnea, chest pain, fever, chills, melena, hematuria, or hematochezia.  Reported that she used to walk 1 to 2 miles 3-5 times a week.  Has been unable to do this for the past 3 weeks because of the worsening shortness of breath.   Labs showed hyponatremia of 130, potassium of 4.3, creatinine of 2.0, albumin of 3.6, elevated proBNP of 17,383, elevated high-sensitivity troponins 40 >80, WBC count of 9.2, and hemoglobin of 10.5.  Chest x-ray showed Enlargement of the cardiopericardial silhouette  with diffuse interstitial opacity suggesting edema. Consolidative opacity in the medial lung bases may be atelectatic although pneumonia not excluded.  EKG showed normal sinus rhythm with a rate of 78.   Has had elevated blood pressures in the 180s over 100s  Past Medical History:  Diagnosis Date   Coronary artery disease    Hyperlipidemia     Past Surgical History:  Procedure Laterality  Date   cataract surgery Bilateral    SKIN SURGERY       Home Medications:  Prior to Admission medications  Medication Sig Start Date End Date Taking? Authorizing Provider  acetaminophen  (TYLENOL ) 500 MG tablet Take 1,000 mg by mouth daily as needed for mild pain (pain score 1-3), moderate pain (pain score 4-6) or fever.   Yes [provider]  AIMOVIG 140 MG/ML SOAJ every 30 (thirty) days. 05/03/24  Yes [provider]  amLODipine  (NORVASC ) 10 MG tablet Take 10 mg by mouth daily. 03/17/24  Yes [provider]  busPIRone  (BUSPAR ) 30 MG tablet Take 15 mg by mouth in the morning and at bedtime.   Yes [provider]  cloNIDine (CATAPRES) 0.1 MG tablet Take 0.1 mg by mouth daily as needed (high BP). 04/21/24  Yes [provider]  dexamethasone (DECADRON) 4 MG tablet TAKE 8MG  (2 X 4MG  TABLETS)IN THE MORNING X 2 DAYS AFTER THE FIRST DAY OF EACH CYCLE,THEN AS DIRECTED 08/01/23  Yes [provider]  ezetimibe  (ZETIA ) 10 MG tablet Take 1 tablet (10 mg total) by mouth daily. 04/22/23 06/03/24 Yes O'Neal, Darryle Ned, MD  ibuprofen (ADVIL) 200 MG tablet Take 200 mg by mouth daily as needed for mild pain (pain score 1-3), moderate pain (pain score 4-6) or fever.   Yes [provider]  levothyroxine  (SYNTHROID ) 25 MCG tablet Take 25 mcg by mouth daily before breakfast.   Yes [provider]  LORazepam  (ATIVAN ) 0.5 MG tablet Take 0.5 mg by mouth daily as needed for anxiety. 07/07/23  Yes [provider]  metoprolol  succinate (TOPROL -XL) 25 MG 24 hr tablet metoprolol  succinate ER 25 mg tablet,extended release 24 hr  TAKE 2 TABLETS BY MOUTH EVERY DAY   Yes [provider]  NURTEC 75 MG TBDP TAKE 1 TABLET BY MOUTH DAILY AS NEEDED FOR MIGRAINE 04/17/23  Yes [provider]  olmesartan (BENICAR) 20 MG tablet TAKE 1 TABLET BY MOUTH EVERY DAY; Duration: 90   Yes [provider]  ondansetron  (ZOFRAN -ODT) 8 MG  disintegrating tablet Take 8 mg by mouth daily as needed for nausea or vomiting.   Yes [provider]  pantoprazole  (PROTONIX ) 40 MG tablet Take 40 mg by mouth daily. 06/01/24 06/01/25 Yes [provider]  predniSONE  (DELTASONE ) 10 MG tablet Take 30 mg by mouth. 06/01/24 06/08/24 Yes [provider]  rosuvastatin  (CRESTOR ) 20 MG tablet Take 1 tablet (20 mg total) by mouth daily. 01/29/24  Yes Meng, Hao, PA  sertraline  (ZOLOFT ) 100 MG tablet Take 200 mg by mouth at bedtime. 04/08/21  Yes [provider]  sulfamethoxazole-trimethoprim (BACTRIM DS) 800-160 MG tablet Take by mouth. 06/01/24 06/08/24 Yes [provider]  SUMAtriptan (IMITREX) 100 MG tablet Take 100 mg by mouth as needed for migraine. May repeat in 2 hours if headache persists or recurs.   Yes [provider]  zolpidem  (AMBIEN ) 10 MG tablet Take 5 mg by mouth at bedtime.   Yes [provider]  ciprofloxacin (CIPRO) 500 MG tablet Take 500 mg by mouth 2 (two) times daily.  for 7 days Patient not taking: Reported on 06/03/2024 05/28/24 06/04/24  [provider]  Fremanezumab-vfrm (AJOVY) 225 MG/1.5ML SOAJ Ajovy 225 mg/1.5 mL subcutaneous auto-injector  Inject by subcutaneous route for 90 days. Patient not taking: Reported on 06/03/2024 09/24/20   [provider]  hydrochlorothiazide (HYDRODIURIL) 25 MG tablet Take 25 mg by mouth every morning. Patient not taking: Reported on 06/03/2024 03/02/24   [provider]    Scheduled Meds:  amLODipine   10 mg Oral Daily   furosemide   40 mg Intravenous BID   metoprolol  succinate  50 mg Oral Daily   pantoprazole   40 mg Oral Daily   Continuous Infusions:   ceFAZolin  (ANCEF ) IV     PRN Meds: acetaminophen  **OR** acetaminophen , albuterol , butalbital -acetaminophen -caffeine , hydrALAZINE , LORazepam , ondansetron  **OR** ondansetron  (ZOFRAN ) IV, oxyCODONE , zolpidem   Allergies:   Allergies[1]  Social History:   Social  History   Socioeconomic History   Marital status: Married    Spouse name: Not on file   Number of children: 2   Years of education: Not on file   Highest education level: Not on file  Occupational History   Occupation: retired   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  Tobacco Use   Smoking status: Never   Smokeless tobacco: Never  Substance and Sexual Activity   Alcohol use: Yes   Drug use: Never   Sexual activity: Not on file  Other Topics Concern   Not on file  Social History Narrative   Not on file   Social Drivers of Health   Tobacco Use: Medium Risk (06/01/2024)   [facility]   Patient History    Smoking Tobacco Use: Former    Smokeless Tobacco Use: Never    Passive Exposure: Not on file  Financial Resource Strain: Not on file  Food Insecurity: No Food Insecurity (06/03/2024)   Epic    Worried About Radiation Protection Practitioner of Food in the Last Year: Never true    Ran Out of Food in the Last Year: Never true  Transportation Needs: No Transportation Needs (06/03/2024)   Epic    Lack of Transportation (Medical): No    Lack of Transportation (Non-Medical): No  Physical Activity: Not on file  Stress: Not on file  Social Connections: Socially Integrated (06/03/2024)   Social Connection and Isolation Panel    Frequency of Communication with Friends and Family: More than three times a week    Frequency of Social Gatherings with Friends and Family: More than three times a week    Attends Religious Services: 1 to 4 times per year    Active Member of [employer] or Organizations: No    Attends Banker Meetings: 1 to 4 times per year    Marital Status: Married  Catering Manager Violence: Not At Risk (06/03/2024)   Epic    Fear of Current or Ex-Partner: No    Emotionally Abused: No    Physically Abused: No    Sexually Abused: No  Depression (PHQ2-9): Not on file  Alcohol Screen: Not on file  Housing: Low Risk (06/03/2024)   Epic    Unable to Pay  for Housing in the Last Year: No    Number of Times Moved in the Last Year: 0    Homeless in the Last Year: No  Utilities: Not on file  Health Literacy: Not on file    Family History:    Family History  Problem Relation Age of Onset   Heart attack Father  Heart attack Brother      ROS:  Please see the history of present illness.   All other ROS reviewed and negative.     Physical Exam/Data: Vitals:   06/03/24 1030 06/03/24 1042 06/03/24 1125 06/03/24 1131  BP: (!) 183/100  (!) 179/101   Pulse: 79  76   Resp: (!) 21  20   Temp:  98.3 F (36.8 C) 98.2 F (36.8 C)   TempSrc:  Oral Oral   SpO2: 97%  95%   Weight:    70.7 kg  Height:    5' 11 (1.803 m)    Intake/Output Summary (Last 24 hours) at 06/03/2024 1253 Last data filed at 06/03/2024 1212 Gross per 24 hour  Intake --  Output 450 ml  Net -450 ml      06/03/2024   11:31 AM 01/23/2024    3:15 PM 07/10/2022   10:09 AM  Last 3 Weights  Weight (lbs) 155 lb 13.8 oz 144 lb 152 lb 6.4 oz  Weight (kg) 70.7 kg 65.318 kg 69.128 kg     Body mass index is 21.74 kg/m.  General:  Well nourished, well developed, in no acute distress.  Alert and oriented on 2 liters nasal cannula HEENT: normal Neck: no JVD Vascular: No carotid bruits; Distal pulses 2+ bilaterally Cardiac:  normal S1, S2; RRR; no murmur  Lungs: Positive bibasilar crackles Abd: soft, nontender, no hepatomegaly  Ext: no edema Musculoskeletal:  No deformities, BUE and BLE strength normal and equal Skin: warm and dry  Neuro:   no focal abnormalities noted Psych:  Normal affect   EKG:  The EKG was personally reviewed and demonstrates:  showed normal sinus rhythm with a rate of 78.  Telemetry:  Telemetry was personally reviewed and demonstrates:  NSR 70-90's.  Relevant CV Studies:  Prior TTE on 06/02/2024 at [facility]  MILD LEFT VENTRICULAR SYSTOLIC DYSFUNCTION WITH MODERATE LVH  ESTIMATED EF: 50%  NORMAL LA PRESSURES WITH DIASTOLIC DYSFUNCTION (GRADE 1)   NORMAL RIGHT VENTRICULAR SYSTOLIC FUNCTION  VALVULAR REGURGITATION: No AR, MODERATE MR, MILD PR, MILD TR                               ESTIMATED RVSP: 58 mmHg (ABNORMAL)  NO VALVULAR STENOSIS                                                                       SMALL PERICARDIAL EFFUSION  ------------------------------------------------------------------------------------------  Moderate pulmonary hypertension.                                                          Pleural effusion noted.      INFERIOR VENA CAVA                 Size: Normal                                  Max Diam: 1.5 cm  Resp.Collapse: Normal Respiratory Collapse                          Laboratory Data: High Sensitivity Troponin:  No results for input(s): TROPONINIHS in the last 720 hours.  Recent Labs  Lab 06/03/24 0656 06/03/24 0850  TRNPT 40* 80*      Chemistry Recent Labs  Lab 06/03/24 0656 06/03/24 0701  NA 129* 130*  K 4.4 4.3  CL 97* 98  CO2 20*  --   GLUCOSE 110* 106*  BUN 20 20  CREATININE 1.84* 2.00*  CALCIUM  8.6*  --   GFRNONAA 30*  --   ANIONGAP 13  --     Recent Labs  Lab 06/03/24 0656  PROT 5.7*  ALBUMIN 3.6  AST 36  ALT 22  ALKPHOS 71  BILITOT 0.5   Lipids No results for input(s): CHOL, TRIG, HDL, LABVLDL, LDLCALC, CHOLHDL in the last 168 hours.  Hematology Recent Labs  Lab 06/03/24 0656 06/03/24 0701  WBC 9.2  --   RBC 3.27*  --   HGB 10.4* 10.5*  HCT 30.3* 31.0*  MCV 92.7  --   MCH 31.8  --   MCHC 34.3  --   RDW 18.2*  --   PLT 580*  --    Thyroid No results for input(s): TSH, FREET4 in the last 168 hours.  BNP Recent Labs  Lab 06/03/24 0656  PROBNP 17,383.0*    DDimer No results for input(s): DDIMER in the last 168 hours.  Radiology/Studies:  DG Chest Portable 1 View Result Date: 06/03/2024 CLINICAL DATA:  Shortness of breath. Personal history of pancreatic cancer. EXAM: PORTABLE CHEST 1 VIEW  COMPARISON:  None Available. FINDINGS: The cardio pericardial silhouette is enlarged. Diffuse interstitial opacity suggests edema. Consolidative opacity in the medial lung bases may be atelectatic although pneumonia not excluded. Right Port-A-Cath evident. Telemetry leads overlie the chest. IMPRESSION: 1. Enlargement of the cardiopericardial silhouette with diffuse interstitial opacity suggesting edema. 2. Consolidative opacity in the medial lung bases may be atelectatic although pneumonia not excluded. Electronically Signed   By: Camellia Candle M.D.   On: 06/03/2024 06:46     Assessment and Plan: Indria Bishara is a 69 y.o. female with a hx of pancreatic cancer with metastases to the liver, hyperlipidemia, history of migraines, CAD with a coronary calcium  score of 351 who is being seen 06/03/2024 for the evaluation of elevated troponins at the request of Burnard Cunning DO.  Acute on chronic diastolic heart failure. Moderate pulmonary hypertension Patient presented to the hospital for worsening shortness of breath that she had overnight.  For the past 3 weeks the patient has had shortness of breath but it is more severe today. A TTE was performed yesterday at [facility] showed an LVEF of 50%, abnormal longitudinal strain -16, moderate concentric LVH, G1DD, mild pericardial effusion, pleural effusion, normal RV function, moderate MR, mild PR, mild TR, and moderate pulmonary hypertension (RV systolic pressure of 58 millimeters of mercury) Prior echocardiogram on 04/2020 showed a normal LVEF of 60 to 65% and RV systolic pressure of 29.9 mmHg. Labs showed  potassium of 4.3, creatinine of 2.0, elevated proBNP of 17,383.  She has had elevated blood pressures in the 180s over 100s. Chest x-ray showed Enlargement of the cardiopericardial silhouette with diffuse interstitial opacity suggesting edema. Consolidative opacity in the medial lung bases may be atelectatic although pneumonia not excluded. Given history  of pancreatic cancer, moderate pulmonary hypertension, and shortness  of breath it is reasonable to consider a VQ scan. Increase lasix  to 80mg  twice daily. GDMT Is currently not a candidate for MRA or SGLT2 given creatinine.   Pancreatic cancer A MRI of the liver on 06/2023 showed a mass on the superior pancreatic neck with multiple liver metastases.  The patient was diagnosed with pancreatic cancer with metastases to the liver.  She has been followed by oncology at [facility].  She was no longer on aspirin due to bleeding concerns.  Patient was previously on gemcitabine this has been held due to worsening edema, fatigue, and shortness of breath. Management per primary and oncology.   Hypertensive emergency Has had elevated blood pressures as high as 192/112. patient has had elevated blood pressures in the 180s over 100s.  Home amlodipine  and metoprolol  were restarted and patient was started on as needed hydralazine . Continue metoprolol  succinate 50 mg daily. Continue amlodipine  10 mg daily.   CAD Elevated troponins Hyperlipidemia Coronary CT was done on 07/2022 and showed a coronary calcium  score of 351 (93rd percentile) with mild stenosis in the proximal LAD, D2, and RCA. Denies any chest pain. elevated high-sensitivity troponins 40 >80 Suspect this is secondary to demand ischemia from acute on chronic heart failure   UTI Was recently diagnosed with a UTI in the outpatient setting. Bactrim was held due to concerns of an AKI.  Was placed on Ancef .  Management per primary   Otherwise management per primary    Risk Assessment/Risk Scores:     [place]  Heart Association (NYHA) Functional Class NYHA Class III       For questions or updates, please contact [facility] Please consult [email or web address] for contact info under     Signed, Morse Clause, PA-C  06/03/2024 12:53 PM      [1]  Allergies Allergen Reactions   Benadryl  [Diphenhydramine ] Other (See Comments)     Very hyper   Sulfa Antibiotics Other (See Comments)    Upset stomach and anxiety   Topiramate Other (See Comments)    Dizziness and lightheadedness    Penicillins Rash    Rash and diarrhea   "

## 2024-06-03 NOTE — Assessment & Plan Note (Signed)
 Likely demand ischemia due to pulmonary edema and severely elevated BP.  Pt denies chest pain and EKG is non-acute. Troponin trend 40 >> 80. --Trend troponin to peak --Stat EKG if chest pain --Mgmt of underlying issues as outlined --Cardiology is consulted

## 2024-06-03 NOTE — ED Triage Notes (Signed)
 Pt BIB [facility] from home. Pt woke up sob this morning. Reports breathing was improving throughout the week rales lung sounds in all fields. Pt was 82% RA upon ems arrival. Pt had echocardiogram performed yesterday. Hx UTI and compliant with Abx, Pancreatic Cancer-last treatment 3 weeks ago. 200/110 100% NR 36RR 110HR

## 2024-06-03 NOTE — Assessment & Plan Note (Signed)
 See HTN emergency

## 2024-06-03 NOTE — Assessment & Plan Note (Signed)
 Na 129>>130 on admission.  Suspect hypervolemic in setting of fluid overload.   --Monitor BMP with diuresis --Further evaluation if not correcting as volume status improves

## 2024-06-03 NOTE — Assessment & Plan Note (Signed)
 Recently started on Protonix  - resume

## 2024-06-03 NOTE — Assessment & Plan Note (Addendum)
 Followed at [facility]. Recently on gemcitabine now being held in the setting of progressive fatigue, dyspnea, now concern for cardiotoxicity. --Follow up with Oncology as scheduled --Symptomatic / supportive care PRN --Resume prednisone  30 mg daily, started by oncology at visit this week

## 2024-06-03 NOTE — Assessment & Plan Note (Signed)
 Resume levothyroxine  pending med hx

## 2024-06-03 NOTE — Assessment & Plan Note (Addendum)
 Pulmonary edema Peripheral Edema, Dyspnea Pleural Effusion Suspect new onset CHF -- proBNP elevated > 17k (was >24k on 1/20) Outpatient Echo done yesterday at [facility] - report is not yet available. --Admit to progressive unit --Secure chat sent to on-call [facility] Cardiologist to help expedite Echo read --Continue diuresis: Lasix  40 mg IV BID --Strict Io's & daily weights --Monitor renal function & electrolytes --Mgmt of HTN as outlined --Outpatient thoracentesis was planned for diagnostic and therapeutic reasons, but on CXR today, they appear too small  ADDENDUM -- Echo report ready --  Shows EF mildly reduced 50%, grade 1 DD, mod MR, mild PR, mild TR, small pericardial effusion, mod pulmonary hypertension, mod LVH.

## 2024-06-03 NOTE — Assessment & Plan Note (Signed)
 Due to pulmonary edema, fluid overload, suspecting new onset CHF.  Initial O2 sat in Triage was 82% on room air.  Patient placed on BiPAP in the ED for work of breathing and appears more comfortable. --BiPAP as needed --Supplement O2 to maintain spO2 > 92% --Mgmt of fluid overload as outlined

## 2024-06-03 NOTE — H&P (Addendum)
 "  Telemedicine History and Physical    Patient: Caroline Fowler FMW:[id number] DOB: 1955/11/12 DOA: 06/03/2024 DOS: the patient was seen and examined on 06/03/2024 PCP: Stephane Leita DEL, MD  Patient coming from: Home   Referring Provider: Larraine Christen, PA-C Telemedicine Provider: Burnard Cunning, DO Provider  Location: [place], [place] Patient Location: [name] Referring Diagnosis: Pulmonary Edema Patient Name and DOB verified: Caroline Fowler, 1955/10/29 Patient consented to Telemedicine Evaluation: Yes RN virtual assistant: Mardy Bellman, RN Video encounter time and date: 06/03/2024 10:10 AM    Chief Complaint:  Chief Complaint  Patient presents with   Shortness of Breath   HPI: Caroline Fowler is a 69 y.o. female with medical history significant of metastatic pancreatic cancer (followed by Oncology at [facility], chemotherapy recently placed on hold per 06/01/2024 office visit), nonobstructive CAD, migraines, hypothyroidism, hyperlipidemia, anxiety with panic attacks, recent UTI on antibiotics, who presented to [name] by EMS from home for evaluation of shortness of breath.  Pt reports she's been getting swelling they've been trying to evaluate at [facility]. Also reports having excessive fatigue after last 2 chemo treatments.  This AM woke up early and felt like she couldn't breath.  Woke up husband to call 911.  No prior episodes like this in the past.  No prior diagnosis of heart failure.  She reports breathing heavier over past 3 weeks, but abruptly worse this AM, typically only with exertion.  Orthopnea new today.  Has a UTI, currently on antibiotic started 2 days ago (Bactim).  No fever/chills.  No cough.  4 weeks ago had sinus congestion that is better now.  No chest pain.  Total 13 lb weight gain since swelling and dyspnea onset.  BP just recently very high, with chemo.    Saw oncology twice in past week due to her symptoms.  Pt reports they felt like gemcitabine  may be causing fatigue and swelling. Steroid started 2 days ago for 7 days to help her feel better, and medication for indigestion. Pt does endorse little less fatigue with steroid.  Also placed on full liquid / soft diet a week ago - pt reports has helped with reducing indigestion and stomach upset.  She does report burping more than usual, stomach feels yuck, but doing better on soft/liquid diet.    Per review of Oncology records, pt has had worsening edema, dyspnea and new small bilateral pleural effusions on CT imaging (05/27/2024), with plan for diagnostic and therapeutic thoracentesis as outpatient. Dyspnea and edema felt possibly side effects of gemcitabine which is now being held with plan to change therapy.  An echo was done yesterday for further evaluation, but had not yet resulted as of this AM.  ED course --  Initial vitals - spO2 in triage was 82% on room air, initial BP elevated to 200/110, RR initially 21>>28, HR 99.  Labs obtained including CMP and CBC were notable for Na 129, Cl 97, bicarb 20, glucose 110, Cr 1.84 >> 2.00, Ca 8.6, Hbg 10.4. proBNP elevated at 17[address].0 Troponin minimally elevated at 40 (no chest pain, no acute ischemic EKG changes), likely mild demand ischemia. Imaging --  -- Portable CXR shows diffuse interstitial opacities consistent with pulmonary edema, enlarged cardiopericardial silhouette, and consolidative opacity in the medial lung bases (atelectasis vs PNA not excluded).  Port-a-cath present.  Patient was treated in the ED with 40 mg IV Lasix  x 1 and 1 nitroglycerin  ointment for elevated BP.  Patient is being admitted to Progressive  unit for further evaluation and management of acute respiratory failure with hypoxia due to fluid overload of etiology to be determined -- suspecting new onset CHF versus possibly related to gemcitabine.     Review of Systems: As mentioned in the history of present illness. All other systems reviewed and are negative.  Past  Medical History:  Diagnosis Date   Coronary artery disease    Hyperlipidemia    Past Surgical History:  Procedure Laterality Date   cataract surgery Bilateral    SKIN SURGERY     Social History:  reports that she has never smoked. She has never used smokeless tobacco. She reports current alcohol use. She reports that she does not use drugs.  Allergies[1]  Family History  Problem Relation Age of Onset   Heart attack Father    Heart attack Brother     Prior to Admission medications  Medication Sig Start Date End Date Taking? Authorizing Provider  ezetimibe  (ZETIA ) 10 MG tablet Take 1 tablet (10 mg total) by mouth daily. 04/22/23 01/23/24  O'NealDarryle Ned, MD  Fremanezumab-vfrm (AJOVY) 225 MG/1.5ML SOAJ Ajovy 225 mg/1.5 mL subcutaneous auto-injector  Inject by subcutaneous route for 90 days. 09/24/20   [provider]  levothyroxine  (SYNTHROID ) 25 MCG tablet Take 25 mcg by mouth daily before breakfast.    [provider]  metoprolol  succinate (TOPROL -XL) 25 MG 24 hr tablet metoprolol  succinate ER 25 mg tablet,extended release 24 hr  TAKE 2 TABLETS BY MOUTH EVERY DAY    [provider]  rosuvastatin  (CRESTOR ) 20 MG tablet Take 1 tablet (20 mg total) by mouth daily. 01/29/24   Meng, Hao, PA  sertraline  (ZOLOFT ) 100 MG tablet Take 100 mg by mouth daily. 04/08/21   [provider]  SUMAtriptan (IMITREX) 100 MG tablet Take 100 mg by mouth as needed for migraine. May repeat in 2 hours if headache persists or recurs.    [provider]    Physical Exam: Vitals:   06/03/24 1131 06/03/24 1433 06/03/24 1550 06/03/24 1821  BP:  (!) 172/95 (!) 174/92 (!) 140/78  Pulse:   81 74  Resp:   20   Temp:   98 F (36.7 C)   TempSrc:   Oral   SpO2:  96% 96%   Weight: 70.7 kg     Height: 5' 11 (1.803 m)      Bedside physical exam was performed by RN listed above. Below exam findings are based on their in person physical exam findings and my  observations during virtual encounter.  General exam: awake, alert, no acute distress, ill-appearing HEENT: voice clear, hearing grossly normal  Respiratory system: diminished in bilateral bases, no wheezes or rhonchi, normal respiratory effort at rest, bipap mask removed, nasal cannula placed on 2 L/min O2. Cardiovascular system: normal S1/S2, RRR, +1 pitting pedal edema.   Gastrointestinal system: soft, NT, ND, +bowel sounds. Central nervous system: A&O x 4. no gross focal neurologic deficits, normal speech Skin: dry, intact, normal temperature, no obvious rashes or wounds Psychiatry: normal mood, congruent affect, judgement and insight appear normal   Data Reviewed:  As reviewed in detail above  Assessment and Plan:  * Fluid overload Pulmonary edema Peripheral Edema, Dyspnea Pleural Effusion Suspect new onset CHF -- proBNP elevated > 17k (was >24k on 1/20) Outpatient Echo done yesterday at [facility] - report is not yet available. --Admit to progressive unit --Secure chat sent to on-call [facility] Cardiologist to help expedite Echo read --Continue diuresis: Lasix  40 mg  IV BID --Strict Io's & daily weights --Monitor renal function & electrolytes --Mgmt of HTN as outlined --Outpatient thoracentesis was planned for diagnostic and therapeutic reasons, but on CXR today, they appear too small  ADDENDUM -- Echo report ready --  Shows EF mildly reduced 50%, grade 1 DD, mod MR, mild PR, mild TR, small pericardial effusion, mod pulmonary hypertension, mod LVH.  Hypertensive emergency Initial BP in triage 200/110. Treated in ED with 1 nitroglycerin  ointment. Last BP 179/105. --Continue nitroglycerin  ointment for now --PRN IV hydralazine  --Resume home amlodipine  10 mg daily, Toprol -XL 50 mg daily --Hold ARB for now with renal function  Acute respiratory failure with hypoxia (HCC) Due to pulmonary edema, fluid overload, suspecting new onset CHF.  Initial O2 sat in Triage was 82% on  room air.  Patient placed on BiPAP in the ED for work of breathing and appears more comfortable. --BiPAP as needed --Supplement O2 to maintain spO2 > 92% --Mgmt of fluid overload as outlined     Migraine headache Pt has history of migraines.  Currently with acute headache, similar to her migraines.  Severely elevated BP and use of nitroglycerin  likely contributing. --Fioricet PRN for now --Husband to bring patient's home Nurtec -- not on formulary - will need order for pt's own med --BP control as outlined  Primary hypertension See HTN emergency  Pancreatic cancer metastasized to liver [facility]) Followed at [facility]. Recently on gemcitabine now being held in the setting of progressive fatigue, dyspnea, now concern for cardiotoxicity. --Follow up with Oncology as scheduled --Symptomatic / supportive care PRN --Resume prednisone  30 mg daily, started by oncology at visit this week  Elevated troponin Likely demand ischemia due to pulmonary edema and severely elevated BP.  Pt denies chest pain and EKG is non-acute. Troponin trend 40 >> 80. --Trend troponin to peak --Stat EKG if chest pain --Mgmt of underlying issues as outlined --Cardiology is consulted  AKI (acute kidney injury)-resolved as of 06/03/2024 Cr on admission 1.84.  Recent Cr elevated noted on outpatient labs 1/20 at 1.7.   Baseline Cr appears around 1.0 (on 05/14/24).  ?Cardiorenal etiology.  Pt also has a UTI and started on Bactrim 2 days ago --Monitor renal function closely with diuresis --Hold Bactrim, will review urine cx & order another antibiotic --Will recheck a BMP this afternoon to assess response to initial Lasix  given in ED --Renally dose meds, minimize nephrotoxins --Further evaluation including renal U/S if not improving  Indigestion Recently started on Protonix  - resume  Cancer-related pain Pain control PRN  Chemotherapy-induced neuropathy No acute issues reported.  Monitor.  Anxiety and  depression Resume home meds pending med history - appears to take Buspar , Zoloft   Hyperlipidemia Resume statin pending med hx  CAD (coronary artery disease) No chest pain.  EKG non-acute. --Resume metoprolol , statin --Appears not on ASA, confirm pending med history  UTI (urinary tract infection) Recently diagnosed UTI in outpatient setting, started Bactrim 2 days ago.   --Hold Bactrim due to AKI --Urine culture reviewed - E. Coli resistant to fluoroquinolones --Note pt has PCN allergy, she is unsure if ever given cephalosporins -- will ask pharmacy to review  ADDENDUM: Pharmacy reviewed w/pt, her PCN allergy was rash and diarrhea 25 yr ago. Pt agreeable to try cephalosporin --will treat with cefazolin   Hyponatremia Na 129>>130 on admission.  Suspect hypervolemic in setting of fluid overload.   --Monitor BMP with diuresis --Further evaluation if not correcting as volume status improves  Insomnia Resume home ambien  PRN, Ativan  PRN  Hypothyroidism Resume levothyroxine  pending med hx      Advance Care Planning:   Code Status: Limited: Do not attempt resuscitation (DNR) -DNR-LIMITED -Do Not Intubate/DNI   --Pt states she has a DNR in place, would not want resuscitation efforts in event of cardiac or respiratory arrest  Consults: Cardiology  Family Communication: Husband at bedside during virtual admission encounter  Severity of Illness: The appropriate patient status for this patient is INPATIENT. Inpatient status is judged to be reasonable and necessary in order to provide the required intensity of service to ensure the patient's safety. The patient's presenting symptoms, physical exam findings, and initial radiographic and laboratory data in the context of their chronic comorbidities is felt to place them at high risk for further clinical deterioration. Furthermore, it is not anticipated that the patient will be medically stable for discharge from the hospital within 2  midnights of admission.   * I certify that at the point of admission it is my clinical judgment that the patient will require inpatient hospital care spanning beyond 2 midnights from the point of admission due to high intensity of service, high risk for further deterioration and high frequency of surveillance required.*  Author: Burnard DELENA Cunning, DO 06/03/2024 6:50 PM  For on call review [email or web address].      [1]  Allergies Allergen Reactions   Benadryl  [Diphenhydramine ] Other (See Comments)    Very hyper   Sulfa Antibiotics Other (See Comments)    Upset stomach and anxiety   Topiramate Other (See Comments)    Dizziness and lightheadedness    Penicillins Rash    Rash and diarrhea   "

## 2024-06-03 NOTE — ED Provider Notes (Signed)
 " [place] EMERGENCY DEPARTMENT AT [facility] Provider Note   CSN: [id number] Arrival date & time: 06/03/24  9371     Patient presents with: No chief complaint on file.   Caroline Fowler is a 69 y.o. female.   Caroline Fowler is a 69 y.o. female with a history of pancreatic adenocarcinoma, hyperlipidemia and CAD, who presents via EMS for evaluation of shortness of breath.  Woke up at 430 this morning with significant worsening in her shortness of breath.  EMS arrived and patient was found to be satting 82% on room air and noted to have rales in all lung fields.  Patient denies associated chest pain, lightheadedness or syncope.  And reports she does have some ongoing shortness of breath but is not significantly worse at night.  She is followed for her care at [facility] and they noted on her recent imaging that she had some new small pleural effusion and she been dealing with some lower extremity edema and ongoing mild dyspnea.  Had an echo done at [facility] yesterday but has not yet received results.  Is scheduled for a diagnostic thoracentesis on Monday to determine whether fluid is cancer related.        Prior to Admission medications  Medication Sig Start Date End Date Taking? Authorizing Provider  ezetimibe  (ZETIA ) 10 MG tablet Take 1 tablet (10 mg total) by mouth daily. 04/22/23 01/23/24  O'NealDarryle Ned, MD  Fremanezumab-vfrm (AJOVY) 225 MG/1.5ML SOAJ Ajovy 225 mg/1.5 mL subcutaneous auto-injector  Inject by subcutaneous route for 90 days. 09/24/20   [provider]  levothyroxine  (SYNTHROID ) 25 MCG tablet Take 25 mcg by mouth daily before breakfast.    [provider]  metoprolol  succinate (TOPROL -XL) 25 MG 24 hr tablet metoprolol  succinate ER 25 mg tablet,extended release 24 hr  TAKE 2 TABLETS BY MOUTH EVERY DAY    [provider]  rosuvastatin  (CRESTOR ) 20 MG tablet Take 1 tablet (20 mg total) by mouth daily. 01/29/24   Meng, Hao, PA   sertraline  (ZOLOFT ) 100 MG tablet Take 100 mg by mouth daily. 04/08/21   [provider]  SUMAtriptan (IMITREX) 100 MG tablet Take 100 mg by mouth as needed for migraine. May repeat in 2 hours if headache persists or recurs.    [provider]    Allergies: Penicillins and Sulfa antibiotics    Review of Systems  Constitutional:  Negative for chills and fever.  Respiratory:  Positive for shortness of breath. Negative for cough and stridor.   Cardiovascular:  Positive for leg swelling. Negative for chest pain.  Gastrointestinal:  Negative for abdominal pain, nausea and vomiting.  Neurological:  Negative for syncope and light-headedness.    Updated Vital Signs BP (!) 172/95 (BP Location: Right Arm)   Pulse 76   Temp 98.2 F (36.8 C) (Oral)   Resp 20   Ht 5' 11 (1.803 m)   Wt 70.7 kg   SpO2 96%   BMI 21.74 kg/m   Physical Exam Vitals and nursing note reviewed.  Constitutional:      General: She is in acute distress.     Appearance: Normal appearance. She is well-developed. She is ill-appearing. She is not diaphoretic.     Comments: Patient is alert but ill-appearing and in acute respiratory distress  HENT:     Head: Normocephalic and atraumatic.  Eyes:     General:        Right eye: No discharge.  Left eye: No discharge.     Pupils: Pupils are equal, round, and reactive to light.  Cardiovascular:     Rate and Rhythm: Normal rate and regular rhythm.     Pulses: Normal pulses.     Heart sounds: Normal heart sounds.  Pulmonary:     Effort: Tachypnea and respiratory distress present.     Breath sounds: Rales present. No wheezing.     Comments: Patient in acute respiratory distress, on nonrebreather satting at 100% but remains tachypneic with increased respiratory effort, on auscultation she has diffuse crackles throughout bilateral lung fields, no wheezing or rhonchi noted.  Diminished breath sounds in bilateral lung bases. Chest:     Chest wall: No  tenderness.  Abdominal:     General: Bowel sounds are normal. There is no distension.     Palpations: Abdomen is soft. There is no mass.     Tenderness: There is no abdominal tenderness. There is no guarding.     Comments: Abdomen soft, nondistended, nontender to palpation in all quadrants without guarding or peritoneal signs  Musculoskeletal:        General: No deformity.     Cervical back: Neck supple.     Right lower leg: Edema present.     Left lower leg: Edema present.     Comments: Trace edema in bilateral lower extremities  Skin:    General: Skin is warm and dry.     Capillary Refill: Capillary refill takes less than 2 seconds.  Neurological:     Mental Status: She is alert and oriented to person, place, and time.     Coordination: Coordination normal.     Comments: Speech is clear, able to follow commands Moves extremities without ataxia, coordination intact  Psychiatric:        Mood and Affect: Mood normal.        Behavior: Behavior normal.     (all labs ordered are listed, but only abnormal results are displayed) Labs Reviewed  PRO BRAIN NATRIURETIC PEPTIDE - Abnormal; Notable for the following components:      Result Value   Pro Brain Natriuretic Peptide 17,383.0 (*)    All other components within normal limits  COMPREHENSIVE METABOLIC PANEL WITH GFR - Abnormal; Notable for the following components:   Sodium 129 (*)    Chloride 97 (*)    CO2 20 (*)    Glucose, Bld 110 (*)    Creatinine, Ser 1.84 (*)    Calcium  8.6 (*)    Total Protein 5.7 (*)    GFR, Estimated 30 (*)    All other components within normal limits  CBC WITH DIFFERENTIAL/PLATELET - Abnormal; Notable for the following components:   RBC 3.27 (*)    Hemoglobin 10.4 (*)    HCT 30.3 (*)    RDW 18.2 (*)    Platelets 580 (*)    Monocytes Absolute 1.4 (*)    All other components within normal limits  I-STAT CHEM 8, ED - Abnormal; Notable for the following components:   Sodium 130 (*)    Creatinine,  Ser 2.00 (*)    Glucose, Bld 106 (*)    Calcium , Ion 1.13 (*)    TCO2 21 (*)    Hemoglobin 10.5 (*)    HCT 31.0 (*)    All other components within normal limits  TROPONIN T, HIGH SENSITIVITY - Abnormal; Notable for the following components:   Troponin T High Sensitivity 40 (*)    All other components within normal  limits  TROPONIN T, HIGH SENSITIVITY - Abnormal; Notable for the following components:   Troponin T High Sensitivity 80 (*)    All other components within normal limits  HIV ANTIBODY (ROUTINE TESTING W REFLEX)  BASIC METABOLIC PANEL WITH GFR  TROPONIN T, HIGH SENSITIVITY    EKG: None  Radiology: DG Chest Portable 1 View Result Date: 06/03/2024 CLINICAL DATA:  Shortness of breath. Personal history of pancreatic cancer. EXAM: PORTABLE CHEST 1 VIEW COMPARISON:  None Available. FINDINGS: The cardio pericardial silhouette is enlarged. Diffuse interstitial opacity suggests edema. Consolidative opacity in the medial lung bases may be atelectatic although pneumonia not excluded. Right Port-A-Cath evident. Telemetry leads overlie the chest. IMPRESSION: 1. Enlargement of the cardiopericardial silhouette with diffuse interstitial opacity suggesting edema. 2. Consolidative opacity in the medial lung bases may be atelectatic although pneumonia not excluded. Electronically Signed   By: Camellia Candle M.D.   On: 06/03/2024 06:46     .Critical Care  Performed by: Alva Larraine FALCON, PA-C Authorized by: Alva Larraine FALCON, PA-C   Critical care provider statement:    Critical care time (minutes):  30   Critical care was necessary to treat or prevent imminent or life-threatening deterioration of the following conditions:  Respiratory failure   Critical care was time spent personally by me on the following activities:  Development of treatment plan with patient or surrogate, discussions with consultants, evaluation of patient's response to treatment, examination of patient, ordering and review of  laboratory studies, ordering and review of radiographic studies, ordering and performing treatments and interventions, pulse oximetry, re-evaluation of patient's condition and review of old charts   Care discussed with: admitting provider      Medications Ordered in the ED  butalbital -acetaminophen -caffeine  (FIORICET) 50-325-40 MG per tablet 1 tablet (has no administration in time range)  acetaminophen  (TYLENOL ) tablet 650 mg (has no administration in time range)    Or  acetaminophen  (TYLENOL ) suppository 650 mg (has no administration in time range)  oxyCODONE  (Oxy IR/ROXICODONE ) immediate release tablet 5 mg (has no administration in time range)  ondansetron  (ZOFRAN ) tablet 4 mg (has no administration in time range)    Or  ondansetron  (ZOFRAN ) injection 4 mg (has no administration in time range)  albuterol  (PROVENTIL ) (2.5 MG/3ML) 0.083% nebulizer solution 2.5 mg (2.5 mg Nebulization Given 06/03/24 1400)  hydrALAZINE  (APRESOLINE ) injection 10 mg (has no administration in time range)  amLODipine  (NORVASC ) tablet 10 mg (10 mg Oral Given 06/03/24 1434)  pantoprazole  (PROTONIX ) EC tablet 40 mg (40 mg Oral Given 06/03/24 1435)  zolpidem  (AMBIEN ) tablet 10 mg (has no administration in time range)  metoprolol  succinate (TOPROL -XL) 24 hr tablet 50 mg (50 mg Oral Given 06/03/24 1434)  LORazepam  (ATIVAN ) tablet 0.5 mg (has no administration in time range)  ceFAZolin  (ANCEF ) IVPB 1 g/50 mL premix (1 g Intravenous New Bag/Given 06/03/24 1502)  furosemide  (LASIX ) injection 80 mg (has no administration in time range)  hydrALAZINE  (APRESOLINE ) tablet 25 mg (has no administration in time range)  furosemide  (LASIX ) injection 40 mg (40 mg Intravenous Given 06/03/24 0734)  nitroGLYCERIN  (NITROGLYN) 2 % ointment 1 inch (1 inch Topical Given 06/03/24 0734)                                    Medical Decision Making Amount and/or Complexity of Data Reviewed Labs: ordered. Radiology: ordered. ECG/medicine tests:  ordered.  Risk Prescription drug management. Decision regarding hospitalization.  69 y.o. female presents to the ED with complaints of shortness of breath, this involves an extensive number of treatment options, and is a complaint that carries with it a high risk of complications and morbidity.  The differential diagnosis includes pulmonary edema, CHF, pleural effusion, pneumothorax, PE, ACS, pneumonia, myocarditis, asthma, COPD  On arrival pt is ill-appearing and in no acute respiratory distress, noted to be tachypneic, requiring nonrebreather and quite hypertensive. Exam significant for rales bilaterally and decreased breath sounds in the lung bases  Additional history obtained from husband at bedside. Previous records obtained and reviewed including recent oncology note from [facility]   I ordered medication including IV Lasix  and Nitropaste for pulmonary edema and hypertension  Lab Tests:  I Ordered, reviewed, and interpreted labs, which included: No leukocytosis, stable chronic anemia with hemoglobin of 10.9, mild hyponatremia of 129 with a creatinine of 1.84, initial troponin is elevated at 40, with proBNP of 17,383, without chest pain or acute EKG changes I suspect the elevated troponin is in the setting of demand with acute pulmonary edema  Imaging Studies ordered:  I ordered imaging studies which included chest x-ray, I independently visualized and interpreted imaging which showed pulmonary edema with small bilateral pleural effusions.  ED Course:   Critical interventions: BiPAP  Patient in respiratory distress on arrival, BiPAP initiated as well as diuresis and Nitropaste to help with blood pressure control with significant improvement in patient's work of breathing.  Chest x-ray and laboratory analysis is consistent with pulmonary edema, patient had echocardiogram at [facility] yesterday but I am currently unable to see results from this study.  Pleural effusions are small and I suspect  they are contributing minimally to this acute change in the patient's shortness of breath.  Will require admission for further respiratory support and diuresis.  Hospitalist consulted for admission  I consulted  and discussed lab and imaging findings with Dr. Burnard Cunning who will see and admit the patient.    Portions of this note were generated with Scientist, clinical (histocompatibility and immunogenetics). Dictation errors may occur despite best attempts at proofreading.      Final diagnoses:  Acute pulmonary edema (HCC)  Acute hypoxic respiratory failure [facility])    ED Discharge Orders     None          Alva Larraine FALCON, [place] 06/03/24 1547  "

## 2024-06-03 NOTE — Assessment & Plan Note (Deleted)
 SABRA

## 2024-06-03 NOTE — Progress Notes (Signed)
 Heart Failure Navigator Progress Note  Assessed for Heart & Vascular TOC clinic readiness.  Patient does not meet criteria due to PMH of metastatic pancreatic cancer.   Navigator available for reassessment of patient.   Duwaine Plant, PharmD, BCPS Heart Failure Stewardship Pharmacist Phone [phone]

## 2024-06-03 NOTE — Assessment & Plan Note (Signed)
Resume statin pending med hx

## 2024-06-03 NOTE — Assessment & Plan Note (Signed)
 No acute issues reported.  Monitor.

## 2024-06-03 NOTE — Assessment & Plan Note (Signed)
 Resume home ambien  PRN, Ativan  PRN

## 2024-06-03 NOTE — Assessment & Plan Note (Signed)
 Pain control PRN

## 2024-06-03 NOTE — Assessment & Plan Note (Signed)
 Resume home meds pending med history - appears to take Buspar , Zoloft 

## 2024-06-03 NOTE — Assessment & Plan Note (Addendum)
 Pt has history of migraines.  Currently with acute headache, similar to her migraines.  Severely elevated BP and use of nitroglycerin  likely contributing. --Fioricet PRN for now --Husband to bring patient's home Nurtec -- not on formulary - will need order for pt's own med --BP control as outlined

## 2024-06-03 NOTE — Assessment & Plan Note (Signed)
 No chest pain.  EKG non-acute. --Resume metoprolol , statin --Appears not on ASA, confirm pending med history

## 2024-06-03 NOTE — Assessment & Plan Note (Addendum)
 Recently diagnosed UTI in outpatient setting, started Bactrim 2 days ago.   --Hold Bactrim due to AKI --Urine culture reviewed - E. Coli resistant to fluoroquinolones --Note pt has PCN allergy, she is unsure if ever given cephalosporins -- will ask pharmacy to review  ADDENDUM: Pharmacy reviewed w/pt, her PCN allergy was rash and diarrhea 25 yr ago. Pt agreeable to try cephalosporin --will treat with cefazolin 

## 2024-06-03 NOTE — Assessment & Plan Note (Addendum)
 Initial BP in triage 200/110. Treated in ED with 1 nitroglycerin  ointment. Last BP 179/105. --Continue nitroglycerin  ointment for now --PRN IV hydralazine  --Resume home amlodipine  10 mg daily, Toprol -XL 50 mg daily --Hold ARB for now with renal function

## 2024-06-03 NOTE — ED Notes (Signed)
 Patient cleaned up. New linens changed.

## 2024-06-03 NOTE — Assessment & Plan Note (Signed)
 Cr on admission 1.84.  Recent Cr elevated noted on outpatient labs 1/20 at 1.7.   Baseline Cr appears around 1.0 (on 05/14/24).  ?Cardiorenal etiology.  Pt also has a UTI and started on Bactrim 2 days ago --Monitor renal function closely with diuresis --Hold Bactrim, will review urine cx & order another antibiotic --Will recheck a BMP this afternoon to assess response to initial Lasix  given in ED --Renally dose meds, minimize nephrotoxins --Further evaluation including renal U/S if not improving

## 2024-06-04 ENCOUNTER — Inpatient Hospital Stay (HOSPITAL_COMMUNITY)

## 2024-06-04 DIAGNOSIS — J810 Acute pulmonary edema: Secondary | ICD-10-CM

## 2024-06-04 DIAGNOSIS — I10 Essential (primary) hypertension: Secondary | ICD-10-CM

## 2024-06-04 DIAGNOSIS — C787 Secondary malignant neoplasm of liver and intrahepatic bile duct: Secondary | ICD-10-CM

## 2024-06-04 DIAGNOSIS — C259 Malignant neoplasm of pancreas, unspecified: Secondary | ICD-10-CM | POA: Diagnosis not present

## 2024-06-04 DIAGNOSIS — I161 Hypertensive emergency: Secondary | ICD-10-CM

## 2024-06-04 DIAGNOSIS — J9601 Acute respiratory failure with hypoxia: Secondary | ICD-10-CM

## 2024-06-04 LAB — CBC
HCT: 27.2 % — ABNORMAL LOW (ref 36.0–46.0)
Hemoglobin: 9.3 g/dL — ABNORMAL LOW (ref 12.0–15.0)
MCH: 32.1 pg (ref 26.0–34.0)
MCHC: 34.2 g/dL (ref 30.0–36.0)
MCV: 93.8 fL (ref 80.0–100.0)
Platelets: 383 K/uL (ref 150–400)
RBC: 2.9 MIL/uL — ABNORMAL LOW (ref 3.87–5.11)
RDW: 18.4 % — ABNORMAL HIGH (ref 11.5–15.5)
WBC: 4.8 K/uL (ref 4.0–10.5)
nRBC: 0 % (ref 0.0–0.2)

## 2024-06-04 LAB — MAGNESIUM: Magnesium: 1.8 mg/dL (ref 1.7–2.4)

## 2024-06-04 LAB — BASIC METABOLIC PANEL WITH GFR
Anion gap: 8 (ref 5–15)
BUN: 20 mg/dL (ref 8–23)
CO2: 22 mmol/L (ref 22–32)
Calcium: 7.2 mg/dL — ABNORMAL LOW (ref 8.9–10.3)
Chloride: 103 mmol/L (ref 98–111)
Creatinine, Ser: 1.84 mg/dL — ABNORMAL HIGH (ref 0.44–1.00)
GFR, Estimated: 30 mL/min — ABNORMAL LOW
Glucose, Bld: 114 mg/dL — ABNORMAL HIGH (ref 70–99)
Potassium: 4.1 mmol/L (ref 3.5–5.1)
Sodium: 134 mmol/L — ABNORMAL LOW (ref 135–145)

## 2024-06-04 LAB — MISC LABCORP TEST (SEND OUT): Labcorp test code: 83935

## 2024-06-04 MED ORDER — FUROSEMIDE 10 MG/ML IJ SOLN: 80.0000 mg | Freq: Two times a day (BID) | INTRAMUSCULAR | Status: DC

## 2024-06-04 MED ORDER — ZOLPIDEM TARTRATE 5 MG PO TABS
5.0000 mg | ORAL_TABLET | Freq: Every evening | ORAL | Status: DC | PRN
  Administered 2024-06-04 – 2024-06-05 (×2): 5 mg via ORAL
  Filled 2024-06-04 (×2): qty 1

## 2024-06-04 MED ORDER — FUROSEMIDE 10 MG/ML IJ SOLN: 80.0000 mg | Freq: Every day | INTRAMUSCULAR | Status: DC

## 2024-06-04 MED ORDER — FUROSEMIDE 10 MG/ML IJ SOLN
80.0000 mg | Freq: Two times a day (BID) | INTRAMUSCULAR | Status: DC
  Administered 2024-06-04 – 2024-06-05 (×3): 80 mg via INTRAVENOUS
  Filled 2024-06-04 (×3): qty 8

## 2024-06-04 MED ORDER — HYDRALAZINE HCL 50 MG PO TABS
50.0000 mg | ORAL_TABLET | Freq: Three times a day (TID) | ORAL | Status: DC
  Administered 2024-06-04 – 2024-06-06 (×7): 50 mg via ORAL
  Filled 2024-06-04 (×7): qty 1

## 2024-06-04 MED ORDER — ENOXAPARIN SODIUM 40 MG/0.4ML IJ SOSY
40.0000 mg | PREFILLED_SYRINGE | Freq: Every day | INTRAMUSCULAR | Status: DC
  Administered 2024-06-04 – 2024-06-06 (×3): 40 mg via SUBCUTANEOUS
  Filled 2024-06-04 (×3): qty 0.4

## 2024-06-04 NOTE — Progress Notes (Signed)
 "   DAILY PROGRESS NOTE   Patient Name: Caroline Fowler Date of Encounter: 06/04/2024 Cardiologist: Darryle ONEIDA Decent, MD  Chief Complaint   Breathing is better  Patient Profile   Caroline Fowler is a 69 y.o. female with a hx of pancreatic cancer with metastases to the liver, hyperlipidemia, history of migraines, CAD with a coronary calcium  score of 351 who is being seen 06/03/2024 for the evaluation of elevated troponins at the request of Burnard Cunning DO.   Subjective   Excellent diuresis overnight- 4L negative. Creatinine appears stable with diuresis. She feels much better today. BP improved as well - now in the 150's systolic.  Objective   Vitals:   06/04/24 0026 06/04/24 0117 06/04/24 0500 06/04/24 0557  BP: (!) 169/94 (!) 141/82  (!) 150/89  Pulse: 76 76  79  Resp: 18   19  Temp: 98.7 F (37.1 C)   98.6 F (37 C)  TempSrc: Oral   Oral  SpO2: 96%   95%  Weight:   71.2 kg   Height:        Intake/Output Summary (Last 24 hours) at 06/04/2024 0804 Last data filed at 06/04/2024 0330 Gross per 24 hour  Intake 290 ml  Output 4400 ml  Net -4110 ml   Filed Weights   06/03/24 1131 06/04/24 0500  Weight: 70.7 kg 71.2 kg    Physical Exam   General appearance: alert and no distress Lungs: diminished breath sounds bibasilar Heart: regular rate and rhythm Extremities: extremities normal, atraumatic, no cyanosis or edema Neurologic: Grossly normal  Inpatient Medications    Scheduled Meds:  amLODipine   10 mg Oral Daily   busPIRone   15 mg Oral BID   Chlorhexidine  Gluconate Cloth  6 each Topical Daily   ezetimibe   10 mg Oral Daily   furosemide   80 mg Intravenous BID   hydrALAZINE   25 mg Oral Q8H   levothyroxine   25 mcg Oral QAC breakfast   metoprolol  succinate  50 mg Oral Daily   pantoprazole   40 mg Oral Daily   predniSONE   30 mg Oral Daily   rosuvastatin   20 mg Oral Daily   sertraline   200 mg Oral QHS   sodium chloride  flush  10-40 mL Intracatheter  Q12H    Continuous Infusions:   ceFAZolin  (ANCEF ) IV 1 g (06/04/24 0606)    PRN Meds: acetaminophen  **OR** acetaminophen , albuterol , butalbital -acetaminophen -caffeine , hydrALAZINE , LORazepam , ondansetron  **OR** ondansetron  (ZOFRAN ) IV, oxyCODONE , Rimegepant Sulfate , zolpidem    Labs   Results for orders placed or performed during the hospital encounter of 06/03/24 (from the past 48 hours)  Pro Brain natriuretic peptide     Status: Abnormal   Collection Time: 06/03/24  6:56 AM  Result Value Ref Range   Pro Brain Natriuretic Peptide 17,383.0 (H) <300.0 pg/mL    Comment: (NOTE) Age Group        Cut-Points    Interpretation  < 50 years     450 pg/mL       NT-proBNP > 450 pg/mL indicates                                ADHF is likely              50 to 75 years  900 pg/mL      NT-proBNP > 900 pg/mL indicates          ADHF is likely  > 75 years  1800 pg/mL     NT-proBNP > 1800 pg/mL indicates          ADHF is likely                           All ages    Results between       Indeterminate. Further clinical             300 and the cut-   information is needed to determine            point for age group   if ADHF is present.                                                             Elecsys proBNP II/ Elecsys proBNP II STAT           Cut-Point                       Interpretation  300 pg/mL                    NT-proBNP <300pg/mL indicates                             ADHF is not likely  Performed at [facility], [address], [place], [place] [address]   Comprehensive metabolic panel     Status: Abnormal   Collection Time: 06/03/24  6:56 AM  Result Value Ref Range   Sodium 129 (L) 135 - 145 mmol/L   Potassium 4.4 3.5 - 5.1 mmol/L   Chloride 97 (L) 98 - 111 mmol/L   CO2 20 (L) 22 - 32 mmol/L   Glucose, Bld 110 (H) 70 - 99 mg/dL    Comment: Glucose reference range applies only to samples taken after fasting for at least 8 hours.   BUN 20 8 - 23  mg/dL   Creatinine, Ser 8.15 (H) 0.44 - 1.00 mg/dL   Calcium  8.6 (L) 8.9 - 10.3 mg/dL   Total Protein 5.7 (L) 6.5 - 8.1 g/dL   Albumin 3.6 3.5 - 5.0 g/dL   AST 36 15 - 41 U/L   ALT 22 0 - 44 U/L   Alkaline Phosphatase 71 38 - 126 U/L   Total Bilirubin 0.5 0.0 - 1.2 mg/dL   GFR, Estimated 30 (L) >60 mL/min    Comment: (NOTE) Calculated using the CKD-EPI Creatinine Equation (2021)    Anion gap 13 5 - 15    Comment: Performed at [facility], [address] [place], [place] [address]  CBC with Differential     Status: Abnormal   Collection Time: 06/03/24  6:56 AM  Result Value Ref Range   WBC 9.2 4.0 - 10.5 K/uL   RBC 3.27 (L) 3.87 - 5.11 MIL/uL   Hemoglobin 10.4 (L) 12.0 - 15.0 g/dL   HCT 69.6 (L) 63.9 - 53.9 %   MCV 92.7 80.0 - 100.0 fL   MCH 31.8 26.0 - 34.0 pg   MCHC 34.3 30.0 - 36.0 g/dL   RDW 81.7 (H) 88.4 - 84.4 %   Platelets 580 (H) 150 - 400 K/uL   nRBC 0.2 0.0 -  0.2 %   Neutrophils Relative % 72 %   Neutro Abs 6.6 1.7 - 7.7 K/uL   Lymphocytes Relative 13 %   Lymphs Abs 1.2 0.7 - 4.0 K/uL   Monocytes Relative 15 %   Monocytes Absolute 1.4 (H) 0.1 - 1.0 K/uL   Eosinophils Relative 0 %   Eosinophils Absolute 0.0 0.0 - 0.5 K/uL   Basophils Relative 0 %   Basophils Absolute 0.0 0.0 - 0.1 K/uL   Immature Granulocytes 0 %   Abs Immature Granulocytes 0.04 0.00 - 0.07 K/uL    Comment: Performed at [facility], [address], [place], [place] [address]  Troponin T, High Sensitivity     Status: Abnormal   Collection Time: 06/03/24  6:56 AM  Result Value Ref Range   Troponin T High Sensitivity 40 (H) 0 - 19 ng/L    Comment: (NOTE) Biotin concentrations > 1000 ng/mL falsely decrease TnT results.  Serial cardiac troponin measurements are suggested.  Refer to the Links section for chest pain algorithms and additional  guidance. Performed at [facility], [address] [place], [place] [address]   I-stat chem  8, ED (not at [facility], [facility] or [facility])     Status: Abnormal   Collection Time: 06/03/24  7:01 AM  Result Value Ref Range   Sodium 130 (L) 135 - 145 mmol/L   Potassium 4.3 3.5 - 5.1 mmol/L   Chloride 98 98 - 111 mmol/L   BUN 20 8 - 23 mg/dL   Creatinine, Ser 7.99 (H) 0.44 - 1.00 mg/dL   Glucose, Bld 893 (H) 70 - 99 mg/dL    Comment: Glucose reference range applies only to samples taken after fasting for at least 8 hours.   Calcium , Ion 1.13 (L) 1.15 - 1.40 mmol/L   TCO2 21 (L) 22 - 32 mmol/L   Hemoglobin 10.5 (L) 12.0 - 15.0 g/dL   HCT 68.9 (L) 63.9 - 53.9 %  Troponin T, High Sensitivity     Status: Abnormal   Collection Time: 06/03/24  8:50 AM  Result Value Ref Range   Troponin T High Sensitivity 80 (H) 0 - 19 ng/L    Comment: Delta check noted  (NOTE) Biotin concentrations > 1000 ng/mL falsely decrease TnT results.  Serial cardiac troponin measurements are suggested.  Refer to the Links section for chest pain algorithms and additional  guidance. Performed at [facility], [address] [place], [place] [address]   Basic metabolic panel     Status: Abnormal   Collection Time: 06/03/24  2:00 PM  Result Value Ref Range   Sodium 131 (L) 135 - 145 mmol/L   Potassium 4.0 3.5 - 5.1 mmol/L   Chloride 98 98 - 111 mmol/L   CO2 23 22 - 32 mmol/L   Glucose, Bld 135 (H) 70 - 99 mg/dL    Comment: Glucose reference range applies only to samples taken after fasting for at least 8 hours.   BUN 21 8 - 23 mg/dL   Creatinine, Ser 8.10 (H) 0.44 - 1.00 mg/dL   Calcium  8.3 (L) 8.9 - 10.3 mg/dL   GFR, Estimated 29 (L) >60 mL/min    Comment: (NOTE) Calculated using the CKD-EPI Creatinine Equation (2021)    Anion gap 10 5 - 15    Comment: Performed at [facility], [address], [place], [place] [address]  Troponin T, High Sensitivity     Status: Abnormal   Collection Time: 06/03/24  2:00 PM  Result Value Ref Range   Troponin T High Sensitivity 98 (H) 0 - 19  ng/L    Comment: (NOTE) Biotin concentrations > 1000 ng/mL falsely decrease TnT results.  Serial cardiac troponin measurements are suggested.  Refer to the Links section for chest pain algorithms and additional  guidance. Performed at [facility], [address], [place], [place] [address]   Miscellaneous [facility] test (send-out)     Status: None   Collection Time: 06/03/24  2:53 PM  Result Value Ref Range   [facility] test code [id number]    [facility] test name [id number]    Source ([facility]) SERUM     Comment: Performed at [facility], [address], [place], [place] [address]   Misc [facility] result COMMENT     Comment: (NOTE) Test Ordered: [id number] HIV Ab/p24 Ag with Reflex HIV Ab/p24 Ag Screen           Note:                     BN     Non Reactive                                                  Reference Range: Non Reactive                          HIV-1/HIV-2 antibodies and HIV-1 p24 antigen were NOT detected. There is no laboratory evidence of HIV infection. HIV Negative Performed At: [facility] [address] [place], [place] [id number] Jennette Shorter MD Ey:[phone]   Basic metabolic panel     Status: Abnormal   Collection Time: 06/04/24  2:16 AM  Result Value Ref Range   Sodium 134 (L) 135 - 145 mmol/L   Potassium 4.1 3.5 - 5.1 mmol/L   Chloride 103 98 - 111 mmol/L   CO2 22 22 - 32 mmol/L   Glucose, Bld 114 (H) 70 - 99 mg/dL    Comment: Glucose reference range applies only to samples taken after fasting for at least 8 hours.   BUN 20 8 - 23 mg/dL   Creatinine, Ser 8.15 (H) 0.44 - 1.00 mg/dL   Calcium  7.2 (L) 8.9 - 10.3 mg/dL   GFR, Estimated 30 (L) >60 mL/min    Comment: (NOTE) Calculated using the CKD-EPI Creatinine Equation (2021)    Anion gap 8 5 - 15    Comment: Performed at [facility], [address] [place], [place] [address]  CBC     Status: Abnormal   Collection Time: 06/04/24  2:16 AM  Result Value Ref  Range   WBC 4.8 4.0 - 10.5 K/uL   RBC 2.90 (L) 3.87 - 5.11 MIL/uL   Hemoglobin 9.3 (L) 12.0 - 15.0 g/dL   HCT 72.7 (L) 63.9 - 53.9 %   MCV 93.8 80.0 - 100.0 fL   MCH 32.1 26.0 - 34.0 pg   MCHC 34.2 30.0 - 36.0 g/dL   RDW 81.5 (H) 88.4 - 84.4 %   Platelets 383 150 - 400 K/uL   nRBC 0.0 0.0 - 0.2 %    Comment: Performed at [facility], [address], [place], [place] [address]  Magnesium     Status: None   Collection Time: 06/04/24  2:16 AM  Result Value Ref Range   Magnesium 1.8 1.7 - 2.4 mg/dL    Comment: Performed at [facility], [address] [place], [place] [address]    ECG   N/A  Telemetry   Normal sinus rhythm - Personally Reviewed  Radiology    DG Chest Port 1 View Result Date: 06/04/2024 CLINICAL DATA:  Pulmonary edema EXAM: PORTABLE CHEST 1 VIEW COMPARISON:  Yesterday FINDINGS: Stable cardiomediastinal silhouette. Right internal jugular Port-A-Cath is unchanged. Mild central pulmonary vascular congestion is noted with probable minimal bilateral pulmonary edema. Increased left basilar opacity is noted concerning for atelectasis or pneumonia with associated pleural effusion. Minimal right pleural effusion is noted as well. IMPRESSION: Mild central pulmonary vascular congestion is noted with probable minimal bilateral pulmonary edema. Increased left basilar opacity is noted concerning for atelectasis or pneumonia with associated pleural effusion. Minimal right pleural effusion is noted as well. Electronically Signed   By: Lynwood Landy Raddle M.D.   On: 06/04/2024 07:58   DG Chest Portable 1 View Result Date: 06/03/2024 CLINICAL DATA:  Shortness of breath. Personal history of pancreatic cancer. EXAM: PORTABLE CHEST 1 VIEW COMPARISON:  None Available. FINDINGS: The cardio pericardial silhouette is enlarged. Diffuse interstitial opacity suggests edema. Consolidative opacity in the medial lung bases may be atelectatic although pneumonia not  excluded. Right Port-A-Cath evident. Telemetry leads overlie the chest. IMPRESSION: 1. Enlargement of the cardiopericardial silhouette with diffuse interstitial opacity suggesting edema. 2. Consolidative opacity in the medial lung bases may be atelectatic although pneumonia not excluded. Electronically Signed   By: Camellia Candle M.D.   On: 06/03/2024 06:46    Cardiac Studies   N/A  Assessment   Principal Problem:   Fluid overload Active Problems:   Acute respiratory failure with hypoxia (HCC)   Pulmonary edema   CAD (coronary artery disease)   Elevated troponin   Hypertensive emergency   Hypothyroidism   Hyperlipidemia   Anxiety and depression   Pancreatic cancer metastasized to liver (HCC)   Chemotherapy-induced neuropathy   Primary hypertension   Insomnia   Cancer-related pain   Indigestion   Hyponatremia   UTI (urinary tract infection)   Migraine headache   Plan   Excellent diuresis - creatinine stable. BNP was very high. Will continue with 80 mg IV BID lasix  today. Monitor renal function closely. BP improved with hydralazine . May need to uptitrate medication this weekend.  Time Spent Directly with Patient:  I have spent a total of 25 minutes with the patient reviewing hospital notes, telemetry, EKGs, labs and examining the patient as well as establishing an assessment and plan that was discussed personally with the patient.  > 50% of time was spent in direct patient care.  Length of Stay:  LOS: 1 day   Vinie KYM Maxcy, MD, [facility], FNLA, FACP  [place]  [facility]  Medical Director of the Advanced Lipid Disorders &  Cardiovascular Risk Reduction Clinic Diplomate of the American Board of Clinical Lipidology Attending Cardiologist  Direct Dial: [phone]  Fax: [phone]  Website:  [email or web address] Vinie BROCKS Waleed Dettman 06/04/2024, 8:04 AM   "

## 2024-06-04 NOTE — Progress Notes (Signed)
 Patient has EMAR order for Ambien  10 mg. Her prescription at home is for 10 mg but patient only takes 5 mg. After scanning and opening the Ambien  patient stated she only takes 5 mg. Spoke with Architectural Technologist and 5 mg were wasted and NP Andrez notified and asked to change the order to reflect the correct amount of 5 mg.

## 2024-06-04 NOTE — Progress Notes (Signed)
 [facility]  PROGRESS NOTE  Caroline Fowler FMW:[id number] DOB: [birth year] DOA: 06/03/2024 PCP: Stephane Leita DEL, MD   Brief HPI:    69 y.o. female with medical history significant of metastatic pancreatic cancer (followed by Oncology at [facility], chemotherapy recently placed on hold per 06/01/2024 office visit), nonobstructive CAD, migraines, hypothyroidism, hyperlipidemia, anxiety with panic attacks, recent UTI on antibiotics, who presented to [name] by EMS from home for evaluation of shortness of breath.  Pt reports she's been getting swelling they've been trying to evaluate at [facility]. Also reports having excessive fatigue after last 2 chemo treatments.  Per review of Oncology records, pt has had worsening edema, dyspnea and new small bilateral pleural effusions on CT imaging (05/27/2024), with plan for diagnostic and therapeutic thoracentesis as outpatient.  CMP and CBC were notable for Na 129, Cl 97, bicarb 20, glucose 110, Cr 1.84 >> 2.00, Ca 8.6, Hbg 10.4. proBNP [place] at [address].0 Troponin minimally elevated at 40 (no chest pain, no acute ischemic EKG changes), likely mild demand ischemia. Imaging --  -- Portable CXR shows diffuse interstitial opacities consistent with pulmonary edema, enlarged cardiopericardial silhouette, and consolidative opacity in the medial lung bases (atelectasis vs PNA not excluded).  Port-a-cath present.   Assessment/Plan:   Fluid overload Pulmonary edema Peripheral Edema, Dyspnea Pleural Effusion Suspect new onset CHF -- proBNP elevated > 17k (was >24k on 1/20) Outpatient Echo done yesterday at [facility] -showed mild systolic dysfunction and grade 1 diastolic dysfunction, moderate pulmonary hypertension EF 50% -Diuresed well with IV Lasix  40 mg twice daily -Cardiology saw the patient today and started Lasix  80 mg IV twice daily, first dose starting at 6 PM tonight -Outpatient thoracentesis was planned for diagnostic and therapeutic reasons,  but on CXR today, they appear too small   -- Echo report ready --  Shows EF mildly reduced 50%, grade 1 DD, mod MR, mild PR, mild TR, small pericardial effusion, mod pulmonary hypertension, mod LVH.   Hypertensive urgency/hypertension  Initial BP in triage 200/110. Treated in ED with 1 nitroglycerin  ointment. -Continue amlodipine  10 mg daily, Toprol  XL 50 mg daily -Increase hydralazine  to 50 mg p.o. every 8 hours --Hold ARB for now with renal function   Acute respiratory failure with hypoxia (HCC) Due to pulmonary edema, fluid overload, suspecting new onset CHF.  Initial O2 sat in Triage was 82% on room air.   Patient placed on BiPAP in the ED for work of breathing and appears more comfortable. --BiPAP as needed --Supplement O2 to maintain spO2 > 92% --Mgmt of fluid overload as outlined above     Migraine headache Pt has history of migraines.  Currently with acute headache, similar to her migraines.  Severely elevated BP and use of nitroglycerin  likely contributing. --Fioricet PRN for now --BP control as outlined   Pancreatic cancer metastasized to liver [facility]) Followed at [facility]. Recently on gemcitabine now being held in the setting of progressive fatigue, dyspnea, now concern for cardiotoxicity. --Follow up with Oncology as scheduled --Symptomatic / supportive care PRN --Resume prednisone  30 mg daily, started by oncology at visit this week   Elevated troponin Likely demand ischemia due to pulmonary edema and severely elevated BP.  Pt denies chest pain and EKG is non-acute. Troponin trend 40 >> 80. --Trend troponin to peak --Stat EKG if chest pain --Mgmt of underlying issues as outlined --Cardiology is consulted   AKI (acute kidney injury)-resolved as of 06/03/2024 Cr on admission 1.84.  Recent Cr elevated noted on outpatient  labs 1/20 at 1.7.   Baseline Cr appears around 1.0 (on 05/14/24).  ?Cardiorenal etiology.  Pt also has a UTI and started on Bactrim 2 days  ago --Monitor renal function closely with diuresis --Hold Bactrim --Renally dose meds, minimize nephrotoxins --Further evaluation including renal U/S if not improving   Indigestion Recently started on Protonix  - resume   Cancer-related pain Pain control PRN   Chemotherapy-induced neuropathy No acute issues reported.  Monitor.   Anxiety and depression Continue BuSpar , Zoloft     Hyperlipidemia Resume statin pending med hx   CAD (coronary artery disease) No chest pain.  EKG non-acute. --Resume metoprolol , statin --Appears not on ASA, confirm pending med history   UTI (urinary tract infection) Recently diagnosed UTI in outpatient setting, started Bactrim 2 days ago.   --Hold Bactrim due to AKI --Urine culture reviewed - E. Coli resistant to fluoroquinolones --Note pt has PCN allergy,   Pharmacy reviewed w/pt, her PCN allergy was rash and diarrhea 25 yr ago. Pt agreeable to try cephalosporin --will treat with cefazolin    Hyponatremia Na 129>>130 on admission.  Suspect hypervolemic in setting of fluid overload.   --Monitor BMP with diuresis --Further evaluation if not correcting as volume status improves   Insomnia Resume home ambien  PRN, Ativan  PRN   Hypothyroidism Resume levothyroxine  pending med hx      DVT prophylaxis: Lovenox   Medications     amLODipine   10 mg Oral Daily   busPIRone   15 mg Oral BID   Chlorhexidine  Gluconate Cloth  6 each Topical Daily   enoxaparin  (LOVENOX ) injection  40 mg Subcutaneous Daily   ezetimibe   10 mg Oral Daily   furosemide   80 mg Intravenous BID   hydrALAZINE   50 mg Oral Q8H   levothyroxine   25 mcg Oral QAC breakfast   metoprolol  succinate  50 mg Oral Daily   pantoprazole   40 mg Oral Daily   predniSONE   30 mg Oral Daily   rosuvastatin   20 mg Oral Daily   sertraline   200 mg Oral QHS   sodium chloride  flush  10-40 mL Intracatheter Q12H     Data Reviewed:   CBG:  No results for input(s): GLUCAP in the last 168  hours.  SpO2: 99 % O2 Flow Rate (L/min): 2 L/min FiO2 (%): (S) 30 %    Vitals:   06/04/24 0117 06/04/24 0500 06/04/24 0557 06/04/24 1107  BP: (!) 141/82  (!) 150/89 (!) 182/101  Pulse: 76  79 74  Resp:   19 19  Temp:   98.6 F (37 C) 98.4 F (36.9 C)  TempSrc:   Oral Oral  SpO2:   95% 99%  Weight:  71.2 kg    Height:          Data Reviewed:  Basic Metabolic Panel: Recent Labs  Lab 06/03/24 0656 06/03/24 0701 06/03/24 1400 06/04/24 0216  NA 129* 130* 131* 134*  K 4.4 4.3 4.0 4.1  CL 97* 98 98 103  CO2 20*  --  23 22  GLUCOSE 110* 106* 135* 114*  BUN 20 20 21 20   CREATININE 1.84* 2.00* 1.89* 1.84*  CALCIUM  8.6*  --  8.3* 7.2*  MG  --   --   --  1.8    CBC: Recent Labs  Lab 06/03/24 0656 06/03/24 0701 06/04/24 0216  WBC 9.2  --  4.8  NEUTROABS 6.6  --   --   HGB 10.4* 10.5* 9.3*  HCT 30.3* 31.0* 27.2*  MCV 92.7  --  93.8  PLT 580*  --  383    LFT Recent Labs  Lab 06/03/24 0656  AST 36  ALT 22  ALKPHOS 71  BILITOT 0.5  PROT 5.7*  ALBUMIN 3.6     Antibiotics: Anti-infectives (From admission, onward)    Start     Dose/Rate Route Frequency Ordered Stop   06/03/24 1400  ceFAZolin  (ANCEF ) IVPB 1 g/50 mL premix        1 g 100 mL/hr over 30 Minutes Intravenous Every 8 hours 06/03/24 1249          CONSULTS   Code Status: DNR  Family Communication: Discussed with patient's husband and daughter at bedside     Subjective   Breathing has improved with IV Lasix .  Diuresed almost 4 L yesterday.  Cardiology saw patient and ordered 80 mg IV twice daily.   Objective    Physical Examination:  General-appears in no acute distress Heart-S1-S2, regular, no murmur auscultated Lungs-clear to auscultation bilaterally, no wheezing or crackles auscultated Abdomen-soft, nontender, no organomegaly Extremities-no edema in the lower extremities Neuro-alert, oriented x3, no focal deficit noted            Kaidence Callaway S Lorrena Goranson   [facility] If 7PM-7AM, please contact night-coverage at [email or web address], Office  [phone]   06/04/2024, 3:31 PM  LOS: 1 day

## 2024-06-04 NOTE — TOC Initial Note (Signed)
 Transition of Care [facility]) - Initial/Assessment Note   Patient Details  Name: Caroline Fowler MRN: [id number] Date of Birth: [birth year]  Transition of Care [facility]) CM/SW Contact:    Duwaine GORMAN Aran, LCSW Phone Number: 06/04/2024, 9:06 AM  Clinical Narrative: Care management consulted for heart failure screening. Heart failure navigation team screened out patient, so consult will be cleared at this time.  Expected Discharge Plan: Home/Self Care Barriers to Discharge: Continued Medical Work up  Patient Goals and CMS Choice Choice offered to / list presented to : NA  Expected Discharge Plan and Services In-house Referral: Clinical Social Work Post Acute Care Choice: NA Living arrangements for the past 2 months: Single Family Home           DME Arranged: N/A DME Agency: NA  Prior Living Arrangements/Services Living arrangements for the past 2 months: Single Family Home Lives with:: Spouse Patient language and need for interpreter reviewed:: Yes Do you feel safe going back to the place where you live?: Yes      Need for Family Participation in Patient Care: No (Comment) Care giver support system in place?: Yes (comment) Criminal Activity/Legal Involvement Pertinent to Current Situation/Hospitalization: No - Comment as needed  Activities of Daily Living ADL Screening (condition at time of admission) Independently performs ADLs?: Yes (appropriate for developmental age) Is the patient deaf or have difficulty hearing?: No Does the patient have difficulty seeing, even when wearing glasses/contacts?: Yes Does the patient have difficulty concentrating, remembering, or making decisions?: No  Emotional Assessment Alcohol  / Substance Use: Not Applicable Psych Involvement: No (comment)  Admission diagnosis:  Acute pulmonary edema (HCC) [J81.0] Pulmonary edema [J81.1] Acute hypoxic respiratory failure (HCC) [J96.01] Patient Active Problem List   Diagnosis Date Noted   Acute  respiratory failure with hypoxia (HCC) 06/03/2024   Fluid overload 06/03/2024   Pulmonary edema 06/03/2024   CAD (coronary artery disease) 06/03/2024   Elevated troponin 06/03/2024   Hypertensive emergency 06/03/2024   Hypothyroidism 06/03/2024   Hyperlipidemia 06/03/2024   Anxiety and depression 06/03/2024   Pancreatic cancer metastasized to liver (HCC) 06/03/2024   Chemotherapy-induced neuropathy 06/03/2024   Primary hypertension 06/03/2024   Insomnia 06/03/2024   Cancer-related pain 06/03/2024   Indigestion 06/03/2024   Hyponatremia 06/03/2024   UTI (urinary tract infection) 06/03/2024   Migraine headache 06/03/2024   PCP:  Stephane Leita DEL, MD Pharmacy:   [facility] [phone] - [place], [place] - [address] AT [facility] [address] [place] [address] Phone: [phone] Fax: [phone]  Social Drivers of Health (SDOH) Social History: SDOH Screenings   Food Insecurity: No Food Insecurity (06/03/2024)  Housing: Low Risk (06/03/2024)  Transportation Needs: No Transportation Needs (06/03/2024)  Utilities: Not At Risk (06/03/2024)  Social Connections: Socially Integrated (06/03/2024)  Tobacco Use: Low Risk (06/03/2024)  Recent Concern: Tobacco Use - Medium Risk (06/01/2024)   Received from [facility]   SDOH Interventions:    Readmission Risk Interventions     No data to display

## 2024-06-05 ENCOUNTER — Inpatient Hospital Stay (HOSPITAL_COMMUNITY)

## 2024-06-05 DIAGNOSIS — E8770 Fluid overload, unspecified: Secondary | ICD-10-CM

## 2024-06-05 DIAGNOSIS — C787 Secondary malignant neoplasm of liver and intrahepatic bile duct: Secondary | ICD-10-CM | POA: Diagnosis not present

## 2024-06-05 DIAGNOSIS — R7989 Other specified abnormal findings of blood chemistry: Secondary | ICD-10-CM

## 2024-06-05 DIAGNOSIS — E871 Hypo-osmolality and hyponatremia: Secondary | ICD-10-CM | POA: Diagnosis not present

## 2024-06-05 DIAGNOSIS — J810 Acute pulmonary edema: Secondary | ICD-10-CM | POA: Diagnosis not present

## 2024-06-05 DIAGNOSIS — C259 Malignant neoplasm of pancreas, unspecified: Secondary | ICD-10-CM | POA: Diagnosis not present

## 2024-06-05 DIAGNOSIS — I161 Hypertensive emergency: Secondary | ICD-10-CM | POA: Diagnosis not present

## 2024-06-05 LAB — BASIC METABOLIC PANEL WITH GFR
Anion gap: 8 (ref 5–15)
BUN: 30 mg/dL — ABNORMAL HIGH (ref 8–23)
CO2: 26 mmol/L (ref 22–32)
Calcium: 8 mg/dL — ABNORMAL LOW (ref 8.9–10.3)
Chloride: 99 mmol/L (ref 98–111)
Creatinine, Ser: 2.01 mg/dL — ABNORMAL HIGH (ref 0.44–1.00)
GFR, Estimated: 27 mL/min — ABNORMAL LOW
Glucose, Bld: 98 mg/dL (ref 70–99)
Potassium: 4 mmol/L (ref 3.5–5.1)
Sodium: 134 mmol/L — ABNORMAL LOW (ref 135–145)

## 2024-06-05 MED ORDER — CARVEDILOL 12.5 MG PO TABS
12.5000 mg | ORAL_TABLET | Freq: Two times a day (BID) | ORAL | Status: DC
  Administered 2024-06-05 – 2024-06-06 (×3): 12.5 mg via ORAL
  Filled 2024-06-05 (×3): qty 1

## 2024-06-05 NOTE — Progress Notes (Signed)
"  °  Progress Note  Patient Name: Caroline Fowler Date of Encounter: 06/05/2024 [facility] Cardiologist: Caroline ONEIDA Decent, MD   Interval Summary   No events overnight. She reports improvement in dyspnea, though still feels overloaded. Great urine output/  Vital Signs Vitals:   06/04/24 1107 06/04/24 2013 06/05/24 0500 06/05/24 0526  BP: (!) 182/101 (!) 155/86  (!) 147/78  Pulse: 74 71  79  Resp: 19 20  18   Temp: 98.4 F (36.9 C) 97.7 F (36.5 C)  98.1 F (36.7 C)  TempSrc: Oral Oral  Oral  SpO2: 99% 98%  95%  Weight:   71 kg   Height:        Intake/Output Summary (Last 24 hours) at 06/05/2024 1129 Last data filed at 06/05/2024 0915 Gross per 24 hour  Intake 600 ml  Output 4850 ml  Net -4250 ml      06/05/2024    5:00 AM 06/04/2024    5:00 AM 06/03/2024   11:31 AM  Last 3 Weights  Weight (lbs) 156 lb 8.4 oz 156 lb 15.5 oz 155 lb 13.8 oz  Weight (kg) 71 kg 71.2 kg 70.7 kg      Telemetry/ECG  NSR - Personally Reviewed  Physical Exam  GEN: No acute distress.   Neck: No JVD Cardiac: RRR, no murmurs, rubs, or gallops.  Respiratory: Mild bibasilar crackles GI: Soft, nontender, non-distended  MS: 2+ edema  Assessment & Plan  [name] is a 82 yoF with Hx of metastatic pancreatic cancer and elevated CAC who presented with mildly elevated trop, AKI and ADHF in setting of BP 200/110 and recently starting steroids. TTE w/ EF 50-55%  #ADHF #Elevated BP - Improvement in symptoms with great urine output overnight. - Cont IV lasix  80 mg BID - Cont hydralazine  50 TID and switch metop XL to coreg  12.5 mg BID - Cont amlo 10, ezetimibe  10, and statin - We will continue to follow up  For questions or updates, please contact [facility] Please consult [email or web address] for contact info under  Signed, Caroline VEAR Ren Donley, MD  "

## 2024-06-05 NOTE — Progress Notes (Signed)
 [facility]  PROGRESS NOTE  Caroline Fowler FMW:[id number] DOB: 28-Sep-1955 DOA: 06/03/2024 PCP: Stephane Leita DEL, MD   Brief HPI:    69 y.o. female with medical history significant of metastatic pancreatic cancer (followed by Oncology at [facility], chemotherapy recently placed on hold per 06/01/2024 office visit), nonobstructive CAD, migraines, hypothyroidism, hyperlipidemia, anxiety with panic attacks, recent UTI on antibiotics, who presented to [name] by EMS from home for evaluation of shortness of breath.  Pt reports she's been getting swelling they've been trying to evaluate at [facility]. Also reports having excessive fatigue after last 2 chemo treatments.  Per review of Oncology records, pt has had worsening edema, dyspnea and new small bilateral pleural effusions on CT imaging (05/27/2024), with plan for diagnostic and therapeutic thoracentesis as outpatient.  CMP and CBC were notable for Na 129, Cl 97, bicarb 20, glucose 110, Cr 1.84 >> 2.00, Ca 8.6, Hbg 10.4. [place] [address].0 Troponin minimally elevated at 40 (no chest pain, no acute ischemic EKG changes), likely mild demand ischemia. Imaging --  -- Portable CXR shows diffuse interstitial opacities consistent with pulmonary edema, enlarged cardiopericardial silhouette, and consolidative opacity in the medial lung bases (atelectasis vs PNA not excluded).  Port-a-cath present.   Assessment/Plan:   Fluid overload Pulmonary edema Peripheral Edema, Dyspnea Pleural Effusion Suspect new onset CHF -- proBNP elevated > 17k (was >24k on 1/20) Outpatient Echo done yesterday at [facility] -showed mild systolic dysfunction and grade 1 diastolic dysfunction, moderate pulmonary hypertension EF 50% -Diuresed well with IV Lasix  40 mg twice daily -Cardiology saw the patient today and started Lasix  80 mg IV twice daily -Diuresed well with IV Lasix , net -8.6 L -Outpatient thoracentesis was planned for diagnostic and therapeutic  reasons, but on CXR today, they appear too small   -- Echo report ready --  Shows EF mildly reduced 50%, grade 1 DD, mod MR, mild PR, mild TR, small pericardial effusion, mod pulmonary hypertension, mod LVH.   Hypertensive urgency/hypertension  Initial BP in triage 200/110. Treated in ED with 1 nitroglycerin  ointment. -Continue amlodipine  10 mg daily, Coreg  12.5 mg p.o. twice daily -Increased hydralazine  to 50 mg p.o. every 8 hours due to high blood pressure --Hold ARB for now with renal function   Acute respiratory failure with hypoxia (HCC) Due to pulmonary edema, fluid overload, suspecting new onset CHF.  Initial O2 sat in Triage was 82% on room air.   Patient placed on BiPAP in the ED for work of breathing and appears more comfortable. --BiPAP as needed --Supplement O2 to maintain spO2 > 92% --Mgmt of fluid overload as outlined above     Migraine headache Pt has history of migraines.  Currently with acute headache, similar to her migraines.  Severely elevated BP and use of nitroglycerin  likely contributing. --Fioricet PRN for now --BP control as outlined   Pancreatic cancer metastasized to liver [facility]) Followed at [facility]. Recently on gemcitabine now being held in the setting of progressive fatigue, dyspnea, now concern for cardiotoxicity. --Follow up with Oncology as scheduled --Symptomatic / supportive care PRN --Resume prednisone  30 mg daily, started by oncology at visit this week   Elevated troponin Likely demand ischemia due to pulmonary edema and severely elevated BP.  Pt denies chest pain and EKG is non-acute. Troponin trend 40 >> 80. --Trend troponin to peak --Stat EKG if chest pain --Mgmt of underlying issues as outlined --Cardiology is consulted   AKI (acute kidney injury)-resolved as of 06/03/2024 Cr on admission 1.84.  Recent Cr elevated noted on outpatient labs 1/20 at 1.7.   -Creatinine up to 2.01 after diuresis Baseline Cr appears around 1.0 (on  05/14/24).  ?Cardiorenal etiology.  Pt also has a UTI and started on Bactrim 2 days ago --Monitor renal function closely with diuresis -Bactrim on hold --Renally dose meds, minimize nephrotoxins    GERD Recently started on Protonix  - resume   Cancer-related pain Pain control PRN   Chemotherapy-induced neuropathy No acute issues reported.  Monitor.   Anxiety and depression Continue BuSpar , Zoloft     Hyperlipidemia Resume statin pending med hx   CAD (coronary artery disease) No chest pain.  EKG non-acute. --Resume metoprolol , statin --Appears not on ASA, confirm pending med history   UTI (urinary tract infection) Recently diagnosed UTI in outpatient setting, started Bactrim 2 days ago.   --Hold Bactrim due to AKI --Urine culture reviewed - E. Coli resistant to fluoroquinolones --Note pt has PCN allergy,   Pharmacy reviewed w/pt, her PCN allergy was rash and diarrhea 25 yr ago. Pt agreeable to try cephalosporin -- Started on cefazolin    Hyponatremia Na 129>>130 on admission.  -Sodium improved to 134 Suspect hypervolemic in setting of fluid overload.   --Monitor BMP with diuresis --Further evaluation if not correcting as volume status improves   Insomnia Resume home ambien  PRN, Ativan  PRN   Hypothyroidism Resume levothyroxine  pending med hx      DVT prophylaxis: Lovenox   Medications     amLODipine   10 mg Oral Daily   busPIRone   15 mg Oral BID   carvedilol   12.5 mg Oral BID WC   Chlorhexidine  Gluconate Cloth  6 each Topical Daily   enoxaparin  (LOVENOX ) injection  40 mg Subcutaneous Daily   ezetimibe   10 mg Oral Daily   furosemide   80 mg Intravenous BID   hydrALAZINE   50 mg Oral Q8H   levothyroxine   25 mcg Oral QAC breakfast   pantoprazole   40 mg Oral Daily   predniSONE   30 mg Oral Daily   rosuvastatin   20 mg Oral Daily   sertraline   200 mg Oral QHS   sodium chloride  flush  10-40 mL Intracatheter Q12H     Data Reviewed:   CBG:  No results for  input(s): GLUCAP in the last 168 hours.  SpO2: 95 % O2 Flow Rate (L/min): 2 L/min FiO2 (%): (S) 30 %    Vitals:   06/04/24 1107 06/04/24 2013 06/05/24 0500 06/05/24 0526  BP: (!) 182/101 (!) 155/86  (!) 147/78  Pulse: 74 71  79  Resp: 19 20  18   Temp: 98.4 F (36.9 C) 97.7 F (36.5 C)  98.1 F (36.7 C)  TempSrc: Oral Oral  Oral  SpO2: 99% 98%  95%  Weight:   71 kg   Height:          Data Reviewed:  Basic Metabolic Panel: Recent Labs  Lab 06/03/24 0656 06/03/24 0701 06/03/24 1400 06/04/24 0216 06/05/24 0228  NA 129* 130* 131* 134* 134*  K 4.4 4.3 4.0 4.1 4.0  CL 97* 98 98 103 99  CO2 20*  --  23 22 26   GLUCOSE 110* 106* 135* 114* 98  BUN 20 20 21 20  30*  CREATININE 1.84* 2.00* 1.89* 1.84* 2.01*  CALCIUM  8.6*  --  8.3* 7.2* 8.0*  MG  --   --   --  1.8  --     CBC: Recent Labs  Lab 06/03/24 0656 06/03/24 0701 06/04/24 0216  WBC 9.2  --  4.8  NEUTROABS 6.6  --   --   HGB 10.4* 10.5* 9.3*  HCT 30.3* 31.0* 27.2*  MCV 92.7  --  93.8  PLT 580*  --  383    LFT Recent Labs  Lab 06/03/24 0656  AST 36  ALT 22  ALKPHOS 71  BILITOT 0.5  PROT 5.7*  ALBUMIN 3.6     Antibiotics: Anti-infectives (From admission, onward)    Start     Dose/Rate Route Frequency Ordered Stop   06/03/24 1400  ceFAZolin  (ANCEF ) IVPB 1 g/50 mL premix        1 g 100 mL/hr over 30 Minutes Intravenous Every 8 hours 06/03/24 1249          CONSULTS   Code Status: DNR  Family Communication: Discussed with patient's husband and daughter at bedside     Subjective   Patient seen and examined, feeling much better.  Diuresed very well with IV Lasix .   Objective    Physical Examination:  General-appears in no acute distress Heart-S1-S2, regular, no murmur auscultated Lungs-crackles at left lung base Abdomen-soft, nontender, no organomegaly Extremities-no edema in the lower extremities Neuro-alert, oriented x3, no focal deficit noted           Ameilia Rattan S  Abdulahi Schor   [facility] If 7PM-7AM, please contact night-coverage at [email or web address], Office  [phone]   06/05/2024, 8:08 AM  LOS: 2 days

## 2024-06-06 DIAGNOSIS — J810 Acute pulmonary edema: Secondary | ICD-10-CM | POA: Diagnosis not present

## 2024-06-06 DIAGNOSIS — I161 Hypertensive emergency: Secondary | ICD-10-CM | POA: Diagnosis not present

## 2024-06-06 DIAGNOSIS — J9601 Acute respiratory failure with hypoxia: Secondary | ICD-10-CM | POA: Diagnosis not present

## 2024-06-06 DIAGNOSIS — R7989 Other specified abnormal findings of blood chemistry: Secondary | ICD-10-CM | POA: Diagnosis not present

## 2024-06-06 LAB — BASIC METABOLIC PANEL WITH GFR
Anion gap: 9 (ref 5–15)
BUN: 30 mg/dL — ABNORMAL HIGH (ref 8–23)
CO2: 29 mmol/L (ref 22–32)
Calcium: 8.3 mg/dL — ABNORMAL LOW (ref 8.9–10.3)
Chloride: 95 mmol/L — ABNORMAL LOW (ref 98–111)
Creatinine, Ser: 1.98 mg/dL — ABNORMAL HIGH (ref 0.44–1.00)
GFR, Estimated: 27 mL/min — ABNORMAL LOW
Glucose, Bld: 115 mg/dL — ABNORMAL HIGH (ref 70–99)
Potassium: 3.6 mmol/L (ref 3.5–5.1)
Sodium: 132 mmol/L — ABNORMAL LOW (ref 135–145)

## 2024-06-06 LAB — MAGNESIUM: Magnesium: 1.9 mg/dL (ref 1.7–2.4)

## 2024-06-06 MED ORDER — FUROSEMIDE 40 MG PO TABS: 40.0000 mg | ORAL_TABLET | Freq: Every day | ORAL | 2 refills | Status: DC

## 2024-06-06 MED ORDER — HYDRALAZINE HCL 50 MG PO TABS: 50.0000 mg | ORAL_TABLET | Freq: Three times a day (TID) | ORAL | 1 refills | Status: DC

## 2024-06-06 MED ORDER — HEPARIN SOD (PORK) LOCK FLUSH 100 UNIT/ML IV SOLN
500.0000 [IU] | INTRAVENOUS | Status: AC | PRN
  Administered 2024-06-06: 500 [IU]

## 2024-06-06 MED ORDER — CARVEDILOL 12.5 MG PO TABS: 12.5000 mg | ORAL_TABLET | Freq: Two times a day (BID) | ORAL | 2 refills | Status: DC

## 2024-06-06 MED ORDER — POLYVINYL ALCOHOL 1.4 % OP SOLN
1.0000 [drp] | OPHTHALMIC | Status: DC | PRN
  Administered 2024-06-06: 1 [drp] via OPHTHALMIC
  Filled 2024-06-06: qty 15

## 2024-06-06 NOTE — Progress Notes (Signed)
"  °  Progress Note  Patient Name: Caroline Fowler Date of Encounter: 06/06/2024 [facility] Cardiologist: Darryle ONEIDA Decent, MD   Interval Summary   Feels great today and denies any dyspnea with walking around. Has been off oxygen since 5 AM.  Vital Signs Vitals:   06/05/24 2147 06/06/24 0125 06/06/24 0553 06/06/24 0556  BP: (!) 168/90 (P) 120/67 (!) 153/80   Pulse: 72  73   Resp: 18  18   Temp: 97.7 F (36.5 C)  98.2 F (36.8 C)   TempSrc: Oral  Oral   SpO2: 95%  93%   Weight:    64.9 kg  Height:        Intake/Output Summary (Last 24 hours) at 06/06/2024 0614 Last data filed at 06/06/2024 0557 Gross per 24 hour  Intake 240 ml  Output 5625 ml  Net -5385 ml      06/06/2024    5:56 AM 06/05/2024    5:00 AM 06/04/2024    5:00 AM  Last 3 Weights  Weight (lbs) 143 lb 1.6 oz 156 lb 8.4 oz 156 lb 15.5 oz  Weight (kg) 64.91 kg 71 kg 71.2 kg      Telemetry/ECG  NSR - Personally Reviewed  Physical Exam  GEN: No acute distress.   Neck: No JVD Cardiac: RRR, no murmurs, rubs, or gallops.  Respiratory: Clear to auscultation bilaterally. GI: Soft, nontender, non-distended  MS: No edema  Assessment & Plan  Ms Inda is a 75 yoF with Hx of metastatic pancreatic cancer and elevated CAC who presented with mildly elevated trop, AKI and ADHF in setting of BP 200/110 and recently starting steroids. TTE w/ EF 50-55%   #ADHF #Elevated BP - Asymptomatic, off supplemental oxygen and appears euvoemic - -5.0L over the last 24 hrs; given elevated BUN, will hold IV lasix  today and transition to PO (40 daily) in AM; If Cr is stable today, okay to d/c with close follow up - Cont hydral 50 TID and coreg  12.5 mg BID - Cont amlo 10, ezetimibe  10, and statin - We will continue to follow up  For questions or updates, please contact [facility] Please consult [email or web address] for contact info under  Signed, Joelle VEAR Ren Donley, MD  "

## 2024-06-06 NOTE — Discharge Summary (Signed)
 " Physician Discharge Summary   Patient: Caroline Fowler MRN: [id number] DOB: 1956-02-25  Admit date:     06/03/2024  Discharge date: 06/06/24  Discharge Physician: Sabas GORMAN Brod   PCP: Stephane Leita DEL, MD   Recommendations at discharge:   Follow-up cardiology as outpatient Follow-up PCP in 1 week  Discharge Diagnoses: Principal Problem:   Fluid overload Active Problems:   Acute respiratory failure with hypoxia (HCC)   Pulmonary edema   Hypertensive emergency   Elevated troponin   Pancreatic cancer metastasized to liver (HCC)   Primary hypertension   Migraine headache   CAD (coronary artery disease)   Hyperlipidemia   Anxiety and depression   Chemotherapy-induced neuropathy   Cancer-related pain   Indigestion   Hypothyroidism   Insomnia   Hyponatremia   UTI (urinary tract infection)  Resolved Problems:   AKI (acute kidney injury)  Hospital Course: 69 y.o. female with medical history significant of metastatic pancreatic cancer (followed by Oncology at [facility], chemotherapy recently placed on hold per 06/01/2024 office visit), nonobstructive CAD, migraines, hypothyroidism, hyperlipidemia, anxiety with panic attacks, recent UTI on antibiotics, who presented to [name] ED by EMS from home for evaluation of shortness of breath.  Pt reports she's been getting swelling they've been trying to evaluate at [facility]. Also reports having excessive fatigue after last 2 chemo treatments.  Per review of Oncology records, pt has had worsening edema, dyspnea and new small bilateral pleural effusions on CT imaging (05/27/2024), with plan for diagnostic and therapeutic thoracentesis as outpatient.  CMP and CBC were notable for Na 129, Cl 97, bicarb 20, glucose 110, Cr 1.84 >> 2.00, Ca 8.6, Hbg 10.4. proBNP elevated at [address].0 Troponin minimally elevated at 40 (no chest pain, no acute ischemic EKG changes), likely mild demand ischemia. Imaging --  -- Portable CXR shows diffuse interstitial  opacities consistent with pulmonary edema, enlarged cardiopericardial silhouette, and consolidative opacity in the medial lung bases (atelectasis vs PNA not excluded).  Port-a-cath present.  Assessment and Plan:  Fluid overload Pulmonary edema Peripheral Edema, Dyspnea Pleural Effusion Suspect new onset CHF -- proBNP elevated > 17k (was >24k on 1/20) Outpatient Echo done yesterday at [facility] -showed mild systolic dysfunction and grade 1 diastolic dysfunction, moderate pulmonary hypertension EF 50% -Diuresed well with IV Lasix  40 mg twice daily -Cardiology saw the patient and started Lasix  80 mg IV twice daily; IV Lasix  currently on hold.  Creatinine today stable at 1.9.  Will discharge on Lasix  40 mg daily. -Diuresed well with IV Lasix , net --12.7 L -Outpatient thoracentesis was planned for diagnostic and therapeutic reasons, but on CXR today, they appear too small   -- Echo report ready --  Shows EF mildly reduced 50%, grade 1 DD, mod MR, mild PR, mild TR, small pericardial effusion, mod pulmonary hypertension, mod LVH.   Hypertensive urgency/hypertension  Initial BP in triage 200/110. -Continue amlodipine  10 mg daily, Coreg  12.5 mg p.o. twice daily -Increased hydralazine  to 50 mg p.o. every 8 hours due to high blood pressure -- Discontinue ARB due to elevated creatinine   Acute respiratory failure with hypoxia (HCC) -Resolved, no longer requiring oxygen Due to pulmonary edema, fluid overload, suspecting new onset CHF.    Migraine headache Pt has history of migraines.   Continue home regimen   Pancreatic cancer metastasized to liver [facility]) Followed at [facility]. Recently on gemcitabine now being held in the setting of progressive fatigue, dyspnea, now concern for cardiotoxicity. --Follow up with Oncology as scheduled --Resume  prednisone  30 mg daily, started by oncology at visit this week   Elevated troponin Likely demand ischemia due to pulmonary edema and severely  elevated BP.  Pt denies chest pain and EKG is non-acute. Troponin trend 40 >> 80. --Cardiology is consulted -No intervention recommended   AKI (acute kidney injury Cr on admission 1.84.  Recent Cr elevated noted on outpatient labs 1/20 at 1.7.   -Creatinine up to 1.98 after diuresis Follow-up cardiology as outpatient for further adjustment of dosing of Lasix  and monitoring of renal function   GERD Recently started on Protonix  - resume   Chemotherapy-induced neuropathy No acute issues reported.  Monitor.   Anxiety and depression Continue BuSpar , Zoloft      Hyperlipidemia Continue statin   CAD (coronary artery disease) No chest pain.  EKG non-acute.    UTI (urinary tract infection) Recently diagnosed UTI in outpatient setting, started Bactrim 2 days ago.   --Hold Bactrim due to AKI --Urine culture reviewed - E. Coli resistant to fluoroquinolones --Note pt has PCN allergy,   Pharmacy reviewed w/pt, her PCN allergy was rash and diarrhea 25 yr ago. Pt agreeable to try cephalosporin -- Started on cefazolin  -Completed 4 days of cefazolin  in the hospital and 2 days of Bactrim.  No further needs of antibiotics.   Hyponatremia Na 129>>130 on admission.  -Sodium improved to 134  Hypothyroidism -Continue Synthroid          Consultants: Cardiology Procedures performed:  Disposition: Home Diet recommendation:  Regular diet DISCHARGE MEDICATION: Allergies as of 06/06/2024       Reactions   Benadryl  [diphenhydramine ] Other (See Comments)   Very hyper   Sulfa Antibiotics Other (See Comments)   Upset stomach and anxiety   Topiramate Other (See Comments)   Dizziness and lightheadedness    Penicillins Rash   Rash and diarrhea        Medication List     STOP taking these medications    ciprofloxacin 500 MG tablet Commonly known as: CIPRO   hydrochlorothiazide 25 MG tablet Commonly known as: HYDRODIURIL   metoprolol  succinate 25 MG 24 hr tablet Commonly  known as: TOPROL -XL   olmesartan 20 MG tablet Commonly known as: BENICAR   sulfamethoxazole-trimethoprim 800-160 MG tablet Commonly known as: BACTRIM DS       TAKE these medications    acetaminophen  500 MG tablet Commonly known as: TYLENOL  Take 1,000 mg by mouth daily as needed for mild pain (pain score 1-3), moderate pain (pain score 4-6) or fever.   Aimovig 140 MG/ML Soaj Generic drug: Erenumab-aooe Inject 140 mg into the skin every 30 (thirty) days.   Ajovy 225 MG/1.5ML Soaj Generic drug: Fremanezumab-vfrm Ajovy 225 mg/1.5 mL subcutaneous auto-injector  Inject by subcutaneous route for 90 days.   amLODipine  5 MG tablet Commonly known as: NORVASC  Take 10 mg by mouth daily. What changed: Another medication with the same name was removed. Continue taking this medication, and follow the directions you see here.   busPIRone  30 MG tablet Commonly known as: BUSPAR  Take 15 mg by mouth in the morning and at bedtime.   carvedilol  12.5 MG tablet Commonly known as: COREG  Take 1 tablet (12.5 mg total) by mouth 2 (two) times daily with a meal.   cloNIDine 0.1 MG tablet Commonly known as: CATAPRES Take 0.1 mg by mouth daily as needed (high BP).   dexamethasone 4 MG tablet Commonly known as: DECADRON TAKE 8MG  (2 X 4MG  TABLETS)IN THE MORNING X 2 DAYS AFTER THE FIRST DAY OF [facility]  CYCLE,THEN AS DIRECTED   ezetimibe  10 MG tablet Commonly known as: ZETIA  Take 1 tablet (10 mg total) by mouth daily.   furosemide  40 MG tablet Commonly known as: Lasix  Take 1 tablet (40 mg total) by mouth daily.   hydrALAZINE  50 MG tablet Commonly known as: APRESOLINE  Take 1 tablet (50 mg total) by mouth every 8 (eight) hours.   ibuprofen 200 MG tablet Commonly known as: ADVIL Take 200 mg by mouth daily as needed for mild pain (pain score 1-3), moderate pain (pain score 4-6) or fever.   levothyroxine  25 MCG tablet Commonly known as: SYNTHROID  Take 25 mcg by mouth daily before breakfast.    LORazepam  0.5 MG tablet Commonly known as: ATIVAN  Take 0.5 mg by mouth daily as needed for anxiety.   Nurtec 75 MG Tbdp Generic drug: Rimegepant Sulfate  TAKE 1 TABLET BY MOUTH DAILY AS NEEDED FOR MIGRAINE   ondansetron  8 MG disintegrating tablet Commonly known as: ZOFRAN -ODT Take 8 mg by mouth daily as needed for nausea or vomiting.   pantoprazole  40 MG tablet Commonly known as: PROTONIX  Take 40 mg by mouth daily.   predniSONE  10 MG tablet Commonly known as: DELTASONE  Take 30 mg by mouth daily.   rosuvastatin  20 MG tablet Commonly known as: CRESTOR  Take 1 tablet (20 mg total) by mouth daily.   sertraline  100 MG tablet Commonly known as: ZOLOFT  Take 200 mg by mouth at bedtime.   SUMAtriptan 100 MG tablet Commonly known as: IMITREX Take 100 mg by mouth as needed for migraine. May repeat in 2 hours if headache persists or recurs.   zolpidem  10 MG tablet Commonly known as: AMBIEN  Take 5 mg by mouth at bedtime.        Follow-up Information     Stephane Leita DEL, MD Follow up in 1 week(s).   Specialty: Internal Medicine Contact information: [address] [place] [address] [phone]         Barbaraann [facility] Ned, MD. Schedule an appointment as soon as possible for a visit.   Specialties: Cardiology, Internal Medicine, Radiology Contact information: [address] [place] [address] [phone]                Discharge Exam: Fredricka Weights   06/04/24 0500 06/05/24 0500 06/06/24 0556  Weight: 71.2 kg 71 kg 64.9 kg   General-appears in no acute distress Heart-S1-S2, regular, no murmur auscultated Lungs-clear to auscultation bilaterally, no wheezing or crackles auscultated Abdomen-soft, nontender, no organomegaly Extremities-no edema in the lower extremities Neuro-alert, oriented x3, no focal deficit noted  Condition at discharge: good  The results of significant diagnostics from this hospitalization (including imaging, microbiology,  ancillary and laboratory) are listed below for reference.   Imaging Studies: DG CHEST PORT 1 VIEW Result Date: 06/05/2024 EXAM: 1 VIEW(S) XRAY OF THE CHEST 06/05/2024 07:50:00 AM COMPARISON: 06/04/2024 CLINICAL HISTORY: Pulmonary edema. FINDINGS: LINES, TUBES AND DEVICES: Stable right chest Port-A-Cath with tip at superior cavoatrial junction. LUNGS AND PLEURA: Stable mildly prominent bilateral predominantly perihilar interstitial markings. Decreased left basilar opacity. Small bilateral pleural effusions. No pneumothorax. HEART AND MEDIASTINUM: No acute abnormality of the cardiac and mediastinal silhouettes. BONES AND SOFT TISSUES: No acute osseous abnormality. IMPRESSION: 1. Mildly prominent bilateral   perihilar interstitial markings. 2. Small bilateral pleural effusions. 3. Decreased left basilar opacity. Electronically signed by: Dayne Hassell MD 06/05/2024 03:21 PM EST RP Workstation: [id number]   DG Chest Port 1 View Result Date: 06/04/2024 CLINICAL DATA:  Pulmonary edema EXAM: PORTABLE CHEST 1 VIEW  COMPARISON:  Yesterday FINDINGS: Stable cardiomediastinal silhouette. Right internal jugular Port-A-Cath is unchanged. Mild central pulmonary vascular congestion is noted with probable minimal bilateral pulmonary edema. Increased left basilar opacity is noted concerning for atelectasis or pneumonia with associated pleural effusion. Minimal right pleural effusion is noted as well. IMPRESSION: Mild central pulmonary vascular congestion is noted with probable minimal bilateral pulmonary edema. Increased left basilar opacity is noted concerning for atelectasis or pneumonia with associated pleural effusion. Minimal right pleural effusion is noted as well. Electronically Signed   By: Lynwood Landy Raddle M.D.   On: 06/04/2024 07:58   DG Chest Portable 1 View Result Date: 06/03/2024 CLINICAL DATA:  Shortness of breath. Personal history of pancreatic cancer. EXAM: PORTABLE CHEST 1 VIEW COMPARISON:  None Available.  FINDINGS: The cardio pericardial silhouette is enlarged. Diffuse interstitial opacity suggests edema. Consolidative opacity in the medial lung bases may be atelectatic although pneumonia not excluded. Right Port-A-Cath evident. Telemetry leads overlie the chest. IMPRESSION: 1. Enlargement of the cardiopericardial silhouette with diffuse interstitial opacity suggesting edema. 2. Consolidative opacity in the medial lung bases may be atelectatic although pneumonia not excluded. Electronically Signed   By: Camellia Candle M.D.   On: 06/03/2024 06:46    Microbiology: Results for orders placed or performed in visit on 04/20/19  Novel Coronavirus, NAA ([facility])     Status: None   Collection Time: 04/20/19 12:00 AM   Specimen: Nasopharyngeal(NP) swabs in vial transport medium   NASOPHARYNGE  TESTING  Result Value Ref Range Status   SARS-CoV-2, NAA Not Detected Not Detected Final    Comment: This nucleic acid amplification test was developed and its performance characteristics determined by [employer]. Nucleic acid amplification tests include PCR and TMA. This test has not been FDA cleared or approved. This test has been authorized by FDA under an Emergency Use Authorization (EUA). This test is only authorized for the duration of time the declaration that circumstances exist justifying the authorization of the emergency use of in vitro diagnostic tests for detection of SARS-CoV-2 virus and/or diagnosis of COVID-19 infection under section 564(b)(1) of the Act, 21 U.S.C. 639aaa-6(a) (1), unless the authorization is terminated or revoked sooner. When diagnostic testing is negative, the possibility of a false negative result should be considered in the context of a patient's recent exposures and the presence of clinical signs and symptoms consistent with COVID-19. An individual without symptoms of COVID-19 and who is not shedding SARS-CoV-2 virus would  expect to have a negative (not detected)  result in this assay.     Labs: CBC: Recent Labs  Lab 06/03/24 0656 06/03/24 0701 06/04/24 0216  WBC 9.2  --  4.8  NEUTROABS 6.6  --   --   HGB 10.4* 10.5* 9.3*  HCT 30.3* 31.0* 27.2*  MCV 92.7  --  93.8  PLT 580*  --  383   Basic Metabolic Panel: Recent Labs  Lab 06/03/24 0656 06/03/24 0701 06/03/24 1400 06/04/24 0216 06/05/24 0228 06/06/24 1159  NA 129* 130* 131* 134* 134* 132*  K 4.4 4.3 4.0 4.1 4.0 3.6  CL 97* 98 98 103 99 95*  CO2 20*  --  23 22 26 29   GLUCOSE 110* 106* 135* 114* 98 115*  BUN 20 20 21 20  30* 30*  CREATININE 1.84* 2.00* 1.89* 1.84* 2.01* 1.98*  CALCIUM  8.6*  --  8.3* 7.2* 8.0* 8.3*  MG  --   --   --  1.8  --  1.9   Liver Function Tests:  Recent Labs  Lab 06/03/24 0656  AST 36  ALT 22  ALKPHOS 71  BILITOT 0.5  PROT 5.7*  ALBUMIN 3.6   CBG: No results for input(s): GLUCAP in the last 168 hours.  Discharge time spent: greater than 30 minutes.  Signed: Sabas GORMAN Brod, MD [facility] 06/06/2024 "

## 2024-06-06 NOTE — Progress Notes (Signed)
 [facility]  PROGRESS NOTE  Montine Hight FMW:[id number] DOB: 28-Aug-1955 DOA: 06/03/2024 PCP: Stephane Leita DEL, MD   Brief HPI:    69 y.o. female with medical history significant of metastatic pancreatic cancer (followed by Oncology at [facility], chemotherapy recently placed on hold per 06/01/2024 office visit), nonobstructive CAD, migraines, hypothyroidism, hyperlipidemia, anxiety with panic attacks, recent UTI on antibiotics, who presented to [name] ED by EMS from home for evaluation of shortness of breath.  Pt reports she's been getting swelling they've been trying to evaluate at [facility]. Also reports having excessive fatigue after last 2 chemo treatments.  Per review of Oncology records, pt has had worsening edema, dyspnea and new small bilateral pleural effusions on CT imaging (05/27/2024), with plan for diagnostic and therapeutic thoracentesis as outpatient.  CMP and CBC were notable for Na 129, Cl 97, bicarb 20, glucose 110, Cr 1.84 >> 2.00, Ca 8.6, Hbg 10.4. proBNP elevated at 17383.0 Troponin minimally elevated at 40 (no chest pain, no acute ischemic EKG changes), likely mild demand ischemia. Imaging --  -- Portable CXR shows diffuse interstitial opacities consistent with pulmonary edema, enlarged cardiopericardial silhouette, and consolidative opacity in the medial lung bases (atelectasis vs PNA not excluded).  Port-a-cath present.   Assessment/Plan:   Fluid overload Pulmonary edema Peripheral Edema, Dyspnea Pleural Effusion Suspect new onset CHF -- proBNP elevated > 17k (was >24k on 1/20) Outpatient Echo done yesterday at [facility] -showed mild systolic dysfunction and grade 1 diastolic dysfunction, moderate pulmonary hypertension EF 50% -Diuresed well with IV Lasix  40 mg twice daily -Cardiology saw the patient and started Lasix  80 mg IV twice daily; IV Lasix  currently on hold.  Will follow today's labs and discharged home on Lasix  40 mg daily if creatinine is  stable -Diuresed well with IV Lasix , net --12.7 L -Outpatient thoracentesis was planned for diagnostic and therapeutic reasons, but on CXR today, they appear too small   -- Echo report ready --  Shows EF mildly reduced 50%, grade 1 DD, mod MR, mild PR, mild TR, small pericardial effusion, mod pulmonary hypertension, mod LVH.   Hypertensive urgency/hypertension  Initial BP in triage 200/110. Treated in ED with 1 nitroglycerin  ointment. -Continue amlodipine  10 mg daily, Coreg  12.5 mg p.o. twice daily -Increased hydralazine  to 50 mg p.o. every 8 hours due to high blood pressure --Hold ARB for now with renal function   Acute respiratory failure with hypoxia (HCC) -Resolved, no longer requiring oxygen Due to pulmonary edema, fluid overload, suspecting new onset CHF.  Initial O2 sat in Triage was 82% on room air.   Patient placed on BiPAP in the ED for work of breathing and appears more comfortable. --Supplement O2 to maintain spO2 > 92% --Mgmt of fluid overload as outlined above     Migraine headache Pt has history of migraines.  Currently with acute headache, similar to her migraines.  Severely elevated BP and use of nitroglycerin  likely contributing. --Fioricet PRN for now --BP control as outlined   Pancreatic cancer metastasized to liver [facility]) Followed at [facility]. Recently on gemcitabine now being held in the setting of progressive fatigue, dyspnea, now concern for cardiotoxicity. --Follow up with Oncology as scheduled --Symptomatic / supportive care PRN --Resume prednisone  30 mg daily, started by oncology at visit this week   Elevated troponin Likely demand ischemia due to pulmonary edema and severely elevated BP.  Pt denies chest pain and EKG is non-acute. Troponin trend 40 >> 80. --Trend troponin to peak --Stat EKG if chest  pain --Mgmt of underlying issues as outlined --Cardiology is consulted   AKI (acute kidney injury Cr on admission 1.84.  Recent Cr elevated noted  on outpatient labs 1/20 at 1.7.   -Creatinine up to 2.01 after diuresis Baseline Cr appears around 1.0 (on 05/14/24).  ?Cardiorenal etiology.  Pt also has a UTI and started on Bactrim 2 days ago --Monitor renal function closely with diuresis -Bactrim on hold --Renally dose meds, minimize nephrotoxins    GERD Recently started on Protonix  - resume   Cancer-related pain Pain control PRN   Chemotherapy-induced neuropathy No acute issues reported.  Monitor.   Anxiety and depression Continue BuSpar , Zoloft     Hyperlipidemia Resume statin pending med hx   CAD (coronary artery disease) No chest pain.  EKG non-acute. --Resume metoprolol , statin --Appears not on ASA, confirm pending med history   UTI (urinary tract infection) Recently diagnosed UTI in outpatient setting, started Bactrim 2 days ago.   --Hold Bactrim due to AKI --Urine culture reviewed - E. Coli resistant to fluoroquinolones --Note pt has PCN allergy,   Pharmacy reviewed w/pt, her PCN allergy was rash and diarrhea 25 yr ago. Pt agreeable to try cephalosporin -- Started on cefazolin    Hyponatremia Na 129>>130 on admission.  -Sodium improved to 134 Suspect hypervolemic in setting of fluid overload.   --Monitor BMP with diuresis --Further evaluation if not correcting as volume status improves   Insomnia Resume home ambien  PRN, Ativan  PRN   Hypothyroidism Resume levothyroxine  pending med hx      DVT prophylaxis: Lovenox   Medications     amLODipine   10 mg Oral Daily   busPIRone   15 mg Oral BID   carvedilol   12.5 mg Oral BID WC   Chlorhexidine  Gluconate Cloth  6 each Topical Daily   enoxaparin  (LOVENOX ) injection  40 mg Subcutaneous Daily   ezetimibe   10 mg Oral Daily   hydrALAZINE   50 mg Oral Q8H   levothyroxine   25 mcg Oral QAC breakfast   pantoprazole   40 mg Oral Daily   predniSONE   30 mg Oral Daily   rosuvastatin   20 mg Oral Daily   sertraline   200 mg Oral QHS   sodium chloride  flush  10-40 mL  Intracatheter Q12H     Data Reviewed:   CBG:  No results for input(s): GLUCAP in the last 168 hours.  SpO2: 93 % O2 Flow Rate (L/min): 2 L/min FiO2 (%): (S) 30 %    Vitals:   06/05/24 2147 06/06/24 0125 06/06/24 0553 06/06/24 0556  BP: (!) 168/90 (P) 120/67 (!) 153/80   Pulse: 72  73   Resp: 18  18   Temp: 97.7 F (36.5 C)  98.2 F (36.8 C)   TempSrc: Oral  Oral   SpO2: 95%  93%   Weight:    64.9 kg  Height:          Data Reviewed:  Basic Metabolic Panel: Recent Labs  Lab 06/03/24 0656 06/03/24 0701 06/03/24 1400 06/04/24 0216 06/05/24 0228  NA 129* 130* 131* 134* 134*  K 4.4 4.3 4.0 4.1 4.0  CL 97* 98 98 103 99  CO2 20*  --  23 22 26   GLUCOSE 110* 106* 135* 114* 98  BUN 20 20 21 20  30*  CREATININE 1.84* 2.00* 1.89* 1.84* 2.01*  CALCIUM  8.6*  --  8.3* 7.2* 8.0*  MG  --   --   --  1.8  --     CBC: Recent Labs  Lab 06/03/24 0656 06/03/24  0701 06/04/24 0216  WBC 9.2  --  4.8  NEUTROABS 6.6  --   --   HGB 10.4* 10.5* 9.3*  HCT 30.3* 31.0* 27.2*  MCV 92.7  --  93.8  PLT 580*  --  383    LFT Recent Labs  Lab 06/03/24 0656  AST 36  ALT 22  ALKPHOS 71  BILITOT 0.5  PROT 5.7*  ALBUMIN 3.6     Antibiotics: Anti-infectives (From admission, onward)    Start     Dose/Rate Route Frequency Ordered Stop   06/03/24 1400  ceFAZolin  (ANCEF ) IVPB 1 g/50 mL premix        1 g 100 mL/hr over 30 Minutes Intravenous Every 8 hours 06/03/24 1249          CONSULTS   Code Status: DNR  Family Communication: Discussed with patient's husband and daughter at bedside     Subjective   Patient seen and examined, breathing has significantly improved.  Diuresed very well with IV Lasix .  Net -12.7 L   Objective    Physical Examination:  General-appears in no acute distress Heart-S1-S2, regular, no murmur auscultated Lungs-clear to auscultation bilaterally, no wheezing or crackles auscultated Abdomen-soft, nontender, no  organomegaly Extremities-no edema in the lower extremities Neuro-alert, oriented x3, no focal deficit noted           Tashari Schoenfelder S Khari Mally   [facility] If 7PM-7AM, please contact night-coverage at [email or web address], Office  [phone]   06/06/2024, 9:16 AM  LOS: 3 days

## 2024-06-06 NOTE — TOC Transition Note (Signed)
 Transition of Care [facility]) - Discharge Note   Patient Details  Name: Caroline Fowler MRN: [id number] Date of Birth: 1955/08/04  Transition of Care [facility]) CM/SW Contact:  Sonda Manuella Quill, RN Phone Number: 06/06/2024, 2:40 PM   Clinical Narrative:    D/C and HHPT orders received; spoke w/ pt over phone; she agreed to recc HHPT; pt does not have agency preference; Cory at [facility] said agency can provide service; agency contact info placed if follow up provider section of d/c instructions; no IP CM needs.   Final next level of care: Home w Home Health Services Barriers to Discharge: No Barriers Identified   Patient Goals and CMS Choice     Choice offered to / list presented to : NA      Discharge Placement                       Discharge Plan and Services Additional resources added to the After Visit Summary for   In-house Referral: Clinical Social Work   Post Acute Care Choice: NA          DME Arranged: N/A DME Agency: NA       HH Arranged: PT HH Agency: [facility] Date [facility] Agency Contacted: 06/06/24 Time HH Agency Contacted: [phone] Representative spoke with at [facility] Agency: Darleene  Social Drivers of Health (SDOH) Interventions SDOH Screenings   Food Insecurity: No Food Insecurity (06/03/2024)  Housing: Low Risk (06/03/2024)  Transportation Needs: No Transportation Needs (06/03/2024)  Utilities: Not At Risk (06/03/2024)  Social Connections: Socially Integrated (06/03/2024)  Tobacco Use: Low Risk (06/03/2024)  Recent Concern: Tobacco Use - Medium Risk (06/01/2024)   Received from [facility]     Readmission Risk Interventions     No data to display

## 2024-06-13 ENCOUNTER — Encounter: Payer: Self-pay | Admitting: Cardiovascular Disease

## 2024-06-13 DIAGNOSIS — I2510 Atherosclerotic heart disease of native coronary artery without angina pectoris: Secondary | ICD-10-CM

## 2024-06-15 MED ORDER — CARVEDILOL 25 MG PO TABS: 25.0000 mg | ORAL_TABLET | Freq: Two times a day (BID) | ORAL | 3 refills | Status: AC

## 2024-06-15 MED ORDER — HYDRALAZINE HCL 100 MG PO TABS: 100.0000 mg | ORAL_TABLET | Freq: Three times a day (TID) | ORAL | 3 refills | Status: AC

## 2024-06-16 ENCOUNTER — Ambulatory Visit (HOSPITAL_COMMUNITY)
Admission: RE | Admit: 2024-06-16 | Discharge: 2024-06-16 | Attending: Cardiovascular Disease | Admitting: Cardiovascular Disease

## 2024-06-16 ENCOUNTER — Ambulatory Visit: Payer: Self-pay | Admitting: Cardiovascular Disease

## 2024-06-16 DIAGNOSIS — I2510 Atherosclerotic heart disease of native coronary artery without angina pectoris: Secondary | ICD-10-CM

## 2024-06-16 LAB — ECHOCARDIOGRAM COMPLETE
Area-P 1/2: 2.87 cm2
MV M vel: 5.18 m/s
MV Peak grad: 107.3 mmHg
S' Lateral: 3.1 cm

## 2024-06-17 NOTE — Progress Notes (Unsigned)
 " Cardiology Office Note:  .   Date:  06/18/2024  ID:  Caroline Fowler, DOB 08/04/1955, MRN [id number] PCP: Stephane Leita DEL, MD  [facility] Providers Cardiologist:  Darryle ONEIDA Decent, MD   History of Present Illness: .    Chief Complaint  Patient presents with   Follow-up    Caroline Fowler is a 69 y.o. female with below history who presents for follow-up.   History of Present Illness   Caroline Fowler is a 69 year old female with nonobstructive coronary artery disease and metastatic pancreatic cancer who presents with concerns about blood pressure management and symptoms following chemotherapy.  She has been undergoing treatment with gemcitabine for metastatic pancreatic cancer for eight months. Recently, she was hospitalized for volume overload attributed to gemcitabine toxicity and acute renal failure. Since discharge, she feels 'wobbly' and 'less steady on her feet', has experienced two severe migraines, and has episodes of dry heaving and vomiting bile after taking medications.  She reports increased fatigue and has reduced her exercise routine due to lack of energy. She is concerned about the impact of her symptoms on her daily life. Her CA19-9 levels, which had been decreasing, increased two weeks ago, and she is scheduled for another test next week.  Her current medications include Lasix  40 mg daily, carvedilol  25 mg twice daily, amlodipine  10 mg daily, hydralazine  100 mg three times daily, and clonidine  as needed. She also takes dexamethasone on days following chemotherapy.  She has a history of a rash on her torso from chemotherapy, which comes and goes. Her last CT scan three weeks ago showed stable disease, and she has not received chemotherapy in the past month. She is monitoring her weight daily and adjusting her Lasix  dose based on weight changes.             Problem List 1. CAD -CAC score 351 (93rd percentile) 2. HLD -Total cholesterol 150,  HDL 60, LDL 72, triglycerides 90 3. Migraine  -on metoprolol   4. Metastatic pancreatic cancer  -gemcitibine toxicity? Renal/volume overload  5. Volume overload 2/2 Gemcitabine    ROS: All other ROS reviewed and negative. Pertinent positives noted in the HPI.     Studies Reviewed: SABRA       TTE 06/16/2024  1. Left ventricular ejection fraction, by estimation, is 60 to 65%. Left  ventricular ejection fraction by 3D volume is 59 %. The left ventricle has  normal function. The left ventricle has no regional wall motion  abnormalities. There is mild concentric  left ventricular hypertrophy. Left ventricular diastolic parameters are  consistent with Grade I diastolic dysfunction (impaired relaxation). The  average left ventricular global longitudinal strain is -18.6 %. The global  longitudinal strain is normal.   2. Right ventricular systolic function is normal. The right ventricular  size is normal. There is mildly elevated pulmonary artery systolic  pressure. The estimated right ventricular systolic pressure is 39.5 mmHg.   3. Left atrial size was mildly dilated.   4. The mitral valve is normal in structure. Mild mitral valve  regurgitation. No evidence of mitral stenosis.   5. The aortic valve is tricuspid. Aortic valve regurgitation is not  visualized. Aortic valve sclerosis/calcification is present, without any  evidence of aortic stenosis.   6. Aortic dilatation noted. There is mild dilatation of the ascending  aorta, measuring 38 mm.   7. The inferior vena cava is normal in size with greater than 50%  respiratory variability, suggesting right  atrial pressure of 3 mmHg.  Physical Exam:   VS:  BP (!) 150/82 (BP Location: Right Arm, Patient Position: Sitting, Cuff Size: Normal)   Pulse 70   Ht 5' 9 (1.753 m)   Wt 149 lb 3.2 oz (67.7 kg)   SpO2 96%   BMI 22.03 kg/m    Wt Readings from Last 3 Encounters:  06/18/24 149 lb 3.2 oz (67.7 kg)  06/06/24 143 lb 1.6 oz (64.9 kg)   01/23/24 144 lb (65.3 kg)    GEN: Well nourished, well developed in no acute distress NECK: No JVD; No carotid bruits CARDIAC: RRR, no murmurs, rubs, gallops RESPIRATORY:  Clear to auscultation without rales, wheezing or rhonchi  ABDOMEN: Soft, non-tender, non-distended EXTREMITIES:  No edema; No deformity  ASSESSMENT AND PLAN: .   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 Blood pressure remains elevated despite current medication. Limited options due to kidney disease. - Take clonidine  0.1 mg BID. Coreg  25 mg BID. Hydralazine  100 mg TID. norvasc  10 mg daily.  - Monitor blood pressure twice daily. - Notify provider next week of blood pressure readings. - Consider reducing blood pressure medications if symptoms persist. - Difficult to determine if symptoms are related to CA, hypertension, or hypertension treatment.   Metastatic pancreatic cancer Stage IV metastatic pancreatic cancer, diagnosed almost a year ago. Cancer is not curable but treatable. Recent CA19-9 levels fluctuated. Last CT scan showed stable disease. Oncologist involved in decision-making process. - Continue to update provider on treatment decisions. - Follow up with oncologist next week.  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 Symptoms may be related to chemotherapy toxicity. - Continue Lasix  40 mg every other day. - Monitor for signs of fluid overload, such as leg swelling or shortness of breath.  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 Kidney function improving but still out of range. - Continue to monitor kidney function.  Hyperlipidemia Non-obstructive CAD Long-term benefit from lipid-lowering agents uncertain due to  advanced cancer. She wishes to discontinue rosuvastatin  and Zetia . - Discontinued rosuvastatin  and Zetia .                Follow-up: Return in about 3 months (around 09/15/2024).  Signed, Darryle DASEN. Barbaraann, MD, [facility]  [facility]  [address] [place], [place] [address] [phone]  1:47 PM   "

## 2024-06-18 ENCOUNTER — Encounter: Payer: Self-pay | Admitting: Cardiovascular Disease

## 2024-06-18 ENCOUNTER — Ambulatory Visit: Admitting: Cardiovascular Disease

## 2024-06-18 VITALS — BP 150/82 | HR 70 | Ht 69.0 in | Wt 149.2 lb

## 2024-06-18 DIAGNOSIS — E785 Hyperlipidemia, unspecified: Secondary | ICD-10-CM

## 2024-06-18 DIAGNOSIS — I2510 Atherosclerotic heart disease of native coronary artery without angina pectoris: Secondary | ICD-10-CM

## 2024-06-18 DIAGNOSIS — I5032 Chronic diastolic (congestive) heart failure: Secondary | ICD-10-CM

## 2024-06-18 DIAGNOSIS — I150 Renovascular hypertension: Secondary | ICD-10-CM

## 2024-06-18 MED ORDER — CLONIDINE HCL 0.1 MG PO TABS: 0.1000 mg | ORAL_TABLET | Freq: Two times a day (BID) | ORAL | 3 refills | Status: AC

## 2024-06-18 MED ORDER — FUROSEMIDE 40 MG PO TABS: 40.0000 mg | ORAL_TABLET | ORAL | 2 refills | Status: AC

## 2024-06-18 NOTE — Patient Instructions (Signed)
 Medication Instructions:  Your physician has recommended you make the following change in your medication:   -Increase Clonidine  (catapres ) 0.1mg  twice daily.  -Decrease Furosemide  (lasix ) to 40mg  every other day.   -Stop taking Rosuvastatin  (crestor ) and Ezetimibe  (Zetia ).  *If you need a refill on your cardiac medications before your next appointment, please call your pharmacy*   Follow-Up: At [facility], you and your health needs are our priority.  As part of our continuing mission to provide you with exceptional heart care, our providers are all part of one team.  This team includes your primary Cardiologist (physician) and Advanced Practice Providers or APPs (Physician Assistants and Nurse Practitioners) who all work together to provide you with the care you need, when you need it.  Your next appointment:   2-3 week(s)  Provider:   Scot Ford, PA-C         We recommend signing up for the patient portal called MyChart.  Sign up information is provided on this After Visit Summary.  MyChart is used to connect with patients for Virtual Visits (Telemedicine).  Patients are able to view lab/test results, encounter notes, upcoming appointments, etc.  Non-urgent messages can be sent to your provider as well.   To learn more about what you can do with MyChart, go to [email or web address].   Other Instructions Please take your blood pressure twice a day for 2-3 weeks. Please include heart rates.  Please bring your log to your next office visit to be reviewed.  HOW TO TAKE YOUR BLOOD PRESSURE: Rest 5 minutes before taking your blood pressure. Dont smoke or drink caffeinated beverages for at least 30 minutes before. Take your blood pressure before (not after) you eat. Sit comfortably with your back supported and both feet on the floor (dont cross your legs). Elevate your arm to heart level on a table or a desk. Use the proper sized cuff. It should fit smoothly and snugly  around your bare upper arm. There should be enough room to slip a fingertip under the cuff. The bottom edge of the cuff should be 1 inch above the crease of the elbow. Ideally, take 3 measurements at one sitting and record the average.  Blood Pressure Log Date/Time Medications taken? (Y/N) Blood Pressure Heart Rate/Pulse

## 2024-06-22 ENCOUNTER — Ambulatory Visit: Admitting: Physician Assistant

## 2024-07-06 ENCOUNTER — Ambulatory Visit: Admitting: Physician Assistant

## 2024-09-15 ENCOUNTER — Ambulatory Visit: Admitting: Cardiovascular Disease
